# Patient Record
Sex: Male | Born: 1940 | Race: White | Hispanic: No | Marital: Married | Smoking: Former smoker
Health system: Southern US, Community
[De-identification: ages and names within clinical notes are randomized; demographics above are authoritative.]

## PROBLEM LIST (undated history)

## (undated) DIAGNOSIS — I10 Essential (primary) hypertension: Secondary | ICD-10-CM

## (undated) DIAGNOSIS — C329 Malignant neoplasm of larynx, unspecified: Secondary | ICD-10-CM

## (undated) DIAGNOSIS — C320 Malignant neoplasm of glottis: Secondary | ICD-10-CM

## (undated) DIAGNOSIS — M549 Dorsalgia, unspecified: Secondary | ICD-10-CM

## (undated) DIAGNOSIS — F32A Depression, unspecified: Secondary | ICD-10-CM

## (undated) DIAGNOSIS — C770 Secondary and unspecified malignant neoplasm of lymph nodes of head, face and neck: Secondary | ICD-10-CM

## (undated) DIAGNOSIS — G8929 Other chronic pain: Secondary | ICD-10-CM

## (undated) DIAGNOSIS — M1990 Unspecified osteoarthritis, unspecified site: Secondary | ICD-10-CM

## (undated) DIAGNOSIS — J383 Other diseases of vocal cords: Secondary | ICD-10-CM

## (undated) DIAGNOSIS — F329 Major depressive disorder, single episode, unspecified: Secondary | ICD-10-CM

## (undated) DIAGNOSIS — K219 Gastro-esophageal reflux disease without esophagitis: Secondary | ICD-10-CM

## (undated) DIAGNOSIS — F419 Anxiety disorder, unspecified: Secondary | ICD-10-CM

## (undated) DIAGNOSIS — F17210 Nicotine dependence, cigarettes, uncomplicated: Secondary | ICD-10-CM

## (undated) HISTORY — PX: HERNIA REPAIR: SHX51

## (undated) HISTORY — PX: PEG TUBE PLACEMENT: SUR1034

## (undated) HISTORY — PX: TYMPANOPLASTY: SHX33

## (undated) HISTORY — PX: PORTA CATH INSERTION: CATH118285

## (undated) HISTORY — DX: Malignant neoplasm of glottis: C32.0

---

## 2006-12-12 ENCOUNTER — Ambulatory Visit: Payer: Self-pay | Admitting: Family Medicine

## 2007-01-28 ENCOUNTER — Ambulatory Visit: Payer: Self-pay | Admitting: Family Medicine

## 2007-04-29 ENCOUNTER — Ambulatory Visit: Payer: Self-pay | Admitting: Family Medicine

## 2015-10-19 ENCOUNTER — Ambulatory Visit (INDEPENDENT_AMBULATORY_CARE_PROVIDER_SITE_OTHER): Payer: Medicare HMO | Admitting: Otolaryngology

## 2015-10-19 DIAGNOSIS — D380 Neoplasm of uncertain behavior of larynx: Secondary | ICD-10-CM

## 2015-10-19 DIAGNOSIS — R490 Dysphonia: Secondary | ICD-10-CM

## 2015-10-20 ENCOUNTER — Other Ambulatory Visit: Payer: Self-pay | Admitting: Otolaryngology

## 2015-10-20 ENCOUNTER — Encounter (HOSPITAL_BASED_OUTPATIENT_CLINIC_OR_DEPARTMENT_OTHER): Payer: Self-pay | Admitting: *Deleted

## 2015-10-24 ENCOUNTER — Ambulatory Visit (HOSPITAL_BASED_OUTPATIENT_CLINIC_OR_DEPARTMENT_OTHER): Payer: Medicare HMO | Admitting: Anesthesiology

## 2015-10-24 ENCOUNTER — Encounter (HOSPITAL_BASED_OUTPATIENT_CLINIC_OR_DEPARTMENT_OTHER)
Admission: RE | Disposition: A | Discharge status: Home / Self Care | Payer: Self-pay | Source: Ambulatory Visit | Attending: Otolaryngology

## 2015-10-24 ENCOUNTER — Ambulatory Visit (HOSPITAL_BASED_OUTPATIENT_CLINIC_OR_DEPARTMENT_OTHER)
Admission: RE | Admit: 2015-10-24 | Discharge: 2015-10-24 | Disposition: A | Discharge status: Home / Self Care | Payer: Medicare HMO | Source: Ambulatory Visit | Attending: Otolaryngology | Admitting: Otolaryngology

## 2015-10-24 ENCOUNTER — Encounter (HOSPITAL_BASED_OUTPATIENT_CLINIC_OR_DEPARTMENT_OTHER): Payer: Self-pay

## 2015-10-24 DIAGNOSIS — I10 Essential (primary) hypertension: Secondary | ICD-10-CM | POA: Insufficient documentation

## 2015-10-24 DIAGNOSIS — F17200 Nicotine dependence, unspecified, uncomplicated: Secondary | ICD-10-CM | POA: Insufficient documentation

## 2015-10-24 DIAGNOSIS — E785 Hyperlipidemia, unspecified: Secondary | ICD-10-CM | POA: Diagnosis not present

## 2015-10-24 DIAGNOSIS — C320 Malignant neoplasm of glottis: Secondary | ICD-10-CM | POA: Insufficient documentation

## 2015-10-24 DIAGNOSIS — R490 Dysphonia: Secondary | ICD-10-CM | POA: Diagnosis present

## 2015-10-24 DIAGNOSIS — D380 Neoplasm of uncertain behavior of larynx: Secondary | ICD-10-CM | POA: Diagnosis not present

## 2015-10-24 HISTORY — DX: Depression, unspecified: F32.A

## 2015-10-24 HISTORY — DX: Unspecified osteoarthritis, unspecified site: M19.90

## 2015-10-24 HISTORY — DX: Gastro-esophageal reflux disease without esophagitis: K21.9

## 2015-10-24 HISTORY — DX: Other chronic pain: G89.29

## 2015-10-24 HISTORY — PX: MICROLARYNGOSCOPY: SHX5208

## 2015-10-24 HISTORY — DX: Anxiety disorder, unspecified: F41.9

## 2015-10-24 HISTORY — DX: Dorsalgia, unspecified: M54.9

## 2015-10-24 HISTORY — DX: Other diseases of vocal cords: J38.3

## 2015-10-24 HISTORY — DX: Essential (primary) hypertension: I10

## 2015-10-24 HISTORY — DX: Major depressive disorder, single episode, unspecified: F32.9

## 2015-10-24 SURGERY — MICROLARYNGOSCOPY
Anesthesia: General | Site: Throat | Laterality: Right

## 2015-10-24 MED ORDER — EPINEPHRINE HCL 1 MG/ML IJ SOLN
INTRAMUSCULAR | Status: DC | PRN
Start: 2015-10-24 — End: 2015-10-24
  Administered 2015-10-24: .5 mL via ENDOTRACHEOPULMONARY

## 2015-10-24 MED ORDER — ONDANSETRON HCL 4 MG/2ML IJ SOLN
INTRAMUSCULAR | Status: DC | PRN
  Administered 2015-10-24: 4 mg via INTRAVENOUS

## 2015-10-24 MED ORDER — DEXAMETHASONE SODIUM PHOSPHATE 10 MG/ML IJ SOLN
INTRAMUSCULAR | Status: AC
  Filled 2015-10-24: qty 1

## 2015-10-24 MED ORDER — CEFAZOLIN SODIUM-DEXTROSE 2-3 GM-% IV SOLR
INTRAVENOUS | Status: AC
  Filled 2015-10-24: qty 50

## 2015-10-24 MED ORDER — SCOPOLAMINE 1 MG/3DAYS TD PT72: 1.0000 | MEDICATED_PATCH | Freq: Once | TRANSDERMAL | Status: DC | PRN

## 2015-10-24 MED ORDER — ONDANSETRON HCL 4 MG/2ML IJ SOLN
INTRAMUSCULAR | Status: AC
  Filled 2015-10-24: qty 2

## 2015-10-24 MED ORDER — PROPOFOL 10 MG/ML IV BOLUS
INTRAVENOUS | Status: DC | PRN
  Administered 2015-10-24: 150 mg via INTRAVENOUS
  Administered 2015-10-24: 50 mg via INTRAVENOUS

## 2015-10-24 MED ORDER — DEXAMETHASONE SODIUM PHOSPHATE 4 MG/ML IJ SOLN
INTRAMUSCULAR | Status: DC | PRN
  Administered 2015-10-24: 10 mg via INTRAVENOUS

## 2015-10-24 MED ORDER — GLYCOPYRROLATE 0.2 MG/ML IJ SOLN: 0.2000 mg | Freq: Once | INTRAMUSCULAR | Status: DC | PRN

## 2015-10-24 MED ORDER — LACTATED RINGERS IV SOLN
INTRAVENOUS | Status: DC
  Administered 2015-10-24: 08:00:00 via INTRAVENOUS

## 2015-10-24 MED ORDER — FENTANYL CITRATE (PF) 100 MCG/2ML IJ SOLN
INTRAMUSCULAR | Status: AC
  Filled 2015-10-24: qty 4

## 2015-10-24 MED ORDER — MIDAZOLAM HCL 2 MG/2ML IJ SOLN
1.0000 mg | INTRAMUSCULAR | Status: DC | PRN
Start: 2015-10-24 — End: 2015-10-24
  Administered 2015-10-24: 2 mg via INTRAVENOUS

## 2015-10-24 MED ORDER — PROPOFOL 500 MG/50ML IV EMUL
INTRAVENOUS | Status: AC
  Filled 2015-10-24: qty 50

## 2015-10-24 MED ORDER — BACITRACIN 500 UNIT/GM EX OINT
TOPICAL_OINTMENT | CUTANEOUS | Status: DC | PRN
  Administered 2015-10-24: 1 via TOPICAL

## 2015-10-24 MED ORDER — HYDROMORPHONE HCL 1 MG/ML IJ SOLN: 0.2500 mg | INTRAMUSCULAR | Status: DC | PRN

## 2015-10-24 MED ORDER — SUCCINYLCHOLINE CHLORIDE 20 MG/ML IJ SOLN
INTRAMUSCULAR | Status: DC | PRN
  Administered 2015-10-24: 100 mg via INTRAVENOUS

## 2015-10-24 MED ORDER — FENTANYL CITRATE (PF) 100 MCG/2ML IJ SOLN
50.0000 ug | INTRAMUSCULAR | Status: DC | PRN
  Administered 2015-10-24: 100 ug via INTRAVENOUS

## 2015-10-24 MED ORDER — ONDANSETRON HCL 4 MG/2ML IJ SOLN: 4.0000 mg | Freq: Once | INTRAMUSCULAR | Status: DC | PRN

## 2015-10-24 MED ORDER — MEPERIDINE HCL 25 MG/ML IJ SOLN: 6.2500 mg | INTRAMUSCULAR | Status: DC | PRN

## 2015-10-24 MED ORDER — MIDAZOLAM HCL 2 MG/2ML IJ SOLN
INTRAMUSCULAR | Status: AC
  Filled 2015-10-24: qty 4

## 2015-10-24 MED ORDER — LIDOCAINE HCL (CARDIAC) 20 MG/ML IV SOLN
INTRAVENOUS | Status: DC | PRN
  Administered 2015-10-24: 100 mg via INTRAVENOUS

## 2015-10-24 SURGICAL SUPPLY — 22 items
CANISTER SUCT 1200ML W/VALVE (MISCELLANEOUS) ×3 IMPLANT
GLOVE BIO SURGEON STRL SZ7.5 (GLOVE) ×3 IMPLANT
GLOVE SURG SS PI 7.0 STRL IVOR (GLOVE) ×2 IMPLANT
GOWN STRL REUS W/ TWL LRG LVL3 (GOWN DISPOSABLE) ×1 IMPLANT
GOWN STRL REUS W/TWL LRG LVL3 (GOWN DISPOSABLE) ×3
GUARD TEETH (MISCELLANEOUS) ×1 IMPLANT
MARKER SKIN DUAL TIP RULER LAB (MISCELLANEOUS) ×3 IMPLANT
NDL HYPO 18GX1.5 BLUNT FILL (NEEDLE) ×1 IMPLANT
NDL SPNL 22GX7 QUINCKE BK (NEEDLE) IMPLANT
NEEDLE HYPO 18GX1.5 BLUNT FILL (NEEDLE) ×3 IMPLANT
NEEDLE SPNL 22GX7 QUINCKE BK (NEEDLE) IMPLANT
NS IRRIG 1000ML POUR BTL (IV SOLUTION) ×3 IMPLANT
PATTIES SURGICAL .5 X3 (DISPOSABLE) ×3 IMPLANT
SHEET MEDIUM DRAPE 40X70 STRL (DRAPES) ×3 IMPLANT
SLEEVE SCD COMPRESS KNEE MED (MISCELLANEOUS) ×1 IMPLANT
SOLUTION BUTLER CLEAR DIP (MISCELLANEOUS) ×3 IMPLANT
SPONGE GAUZE 4X4 12PLY STER LF (GAUZE/BANDAGES/DRESSINGS) ×3 IMPLANT
SYR CONTROL 10ML LL (SYRINGE) ×3 IMPLANT
SYR TB 1ML LL NO SAFETY (SYRINGE) ×1 IMPLANT
TOWEL OR 17X24 6PK STRL BLUE (TOWEL DISPOSABLE) ×3 IMPLANT
TUBE CONNECTING 20'X1/4 (TUBING) ×1
TUBE CONNECTING 20X1/4 (TUBING) ×2 IMPLANT

## 2015-10-24 NOTE — Op Note (Signed)
DATE OF PROCEDURE:  10/24/2015                              OPERATIVE REPORT  SURGEON:  Leta Baptist, MD  PREOPERATIVE DIAGNOSES: 1. Hoarseness 2. Right vocal cord mass  POSTOPERATIVE DIAGNOSES: 1. Hoarseness 2. Right vocal cord mass  PROCEDURE PERFORMED:  MicroDirect laryngoscopy with biopsy of right vocal cord mass  ANESTHESIA:  General endotracheal tube anesthesia.  COMPLICATIONS:  None.  ESTIMATED BLOOD LOSS:  Minimal.  INDICATION FOR PROCEDURE:  James Swanson is a 74 y.o. male with a 3 month history of hoarseness. The patient has a long history of tobacco use. He has been smoking for more than 50 years. At his clinic visit, he was noted to have a large fungating right vocal cord mass. The mass was concerning for squamous cell carcinoma. Based on the above findings, the decision was made for the patient to undergo the microdirect laryngoscopy and biopsy procedure. The risks, benefits, alternatives, and details of the procedure were discussed with the patient.  Questions were invited and answered.  Informed consent was obtained.  DESCRIPTION:  The patient was taken to the operating room and placed supine on the operating table.  General endotracheal tube anesthesia was administered by the anesthesiologist.  The patient was positioned and prepped and draped in a standard fashion for direct laryngoscopy. A Dedo laryngoscope was used for examination. The laryngoscope was inserted via the oral cavity into the pharynx. The epiglottis, aryepiglottic folds, arytenoids, vallecula, and piriform sinuses were all normal. Examination of the vocal cords revealed a large fungating mass, replacing the anterior two third of the right vocal cord. The mass extended into the subglottis. It should be noted that the vocal cords were mobile during his clinic examination. The laryngoscope was suspended with a Lewy suspender. A microscope was brought into the field. Under the microscope, multiple large biopsy  specimens were obtained from the right vocal cord. The specimens were sent to the pathology department for permanent histologic identification. Hemostasis was achieved with pledgets soaked with epinephrine.  The care of the patient was turned over to the anesthesiologist.  The patient was awakened from anesthesia without difficulty.  He was extubated and transferred to the recovery room in good condition.  OPERATIVE FINDINGS:  A fungating mass was noted to cover the anterior two third of the right vocal cord.  SPECIMEN:  None.  FOLLOWUP CARE:  The patient will be discharged home once awake and alert.The patient will follow up in my office in approximately 1 week.  Otilia Kareem,SUI W 10/24/2015 8:52 AM

## 2015-10-24 NOTE — Anesthesia Postprocedure Evaluation (Signed)
Anesthesia Post Note  Patient: James Swanson  Procedure(s) Performed: Procedure(s) (LRB): MICRO DIRECT LARYNGOSCOPY WITH VOCAL CORD BIOPSY  (Right)  Anesthesia type: general  Patient location: PACU  Post pain: Pain level controlled  Post assessment: Patient's Cardiovascular Status Stable  Last Vitals:  Filed Vitals:   10/24/15 0950  BP: 138/73  Pulse: 67  Temp: 36.4 C  Resp: 22    Post vital signs: Reviewed and stable  Level of consciousness: sedated  Complications: No apparent anesthesia complications

## 2015-10-24 NOTE — Transfer of Care (Signed)
Immediate Anesthesia Transfer of Care Note  Patient: James Swanson  Procedure(s) Performed: Procedure(s): MICRO DIRECT LARYNGOSCOPY WITH VOCAL CORD BIOPSY  (Right)  Patient Location: PACU  Anesthesia Type:General  Level of Consciousness: awake, alert  and oriented  Airway & Oxygen Therapy: Patient Spontanous Breathing and Patient connected to face mask oxygen  Post-op Assessment: Report given to RN and Post -op Vital signs reviewed and stable  Post vital signs: Reviewed and stable  Last Vitals:  Filed Vitals:   10/24/15 0655  BP: 149/71  Pulse: 66  Temp: 36.4 C  Resp: 18    Complications: No apparent anesthesia complications

## 2015-10-24 NOTE — Discharge Instructions (Addendum)
The patient may resume all his previous activities and diet. The patient will follow up in my [place] office in one week.   Post Anesthesia Home Care Instructions  Activity: Get plenty of rest for the remainder of the day. A responsible adult should stay with you for 24 hours following the procedure.  For the next 24 hours, DO NOT: -Drive a car -Paediatric nurse -Drink alcoholic beverages -Take any medication unless instructed by your physician -Make any legal decisions or sign important papers.  Meals: Start with liquid foods such as gelatin or soup. Progress to regular foods as tolerated. Avoid greasy, spicy, heavy foods. If nausea and/or vomiting occur, drink only clear liquids until the nausea and/or vomiting subsides. Call your physician if vomiting continues.  Special Instructions/Symptoms: Your throat may feel dry or sore from the anesthesia or the breathing tube placed in your throat during surgery. If this causes discomfort, gargle with warm salt water. The discomfort should disappear within 24 hours.  If you had a scopolamine patch placed behind your ear for the management of post- operative nausea and/or vomiting:  1. The medication in the patch is effective for 72 hours, after which it should be removed.  Wrap patch in a tissue and discard in the trash. Wash hands thoroughly with soap and water. 2. You may remove the patch earlier than 72 hours if you experience unpleasant side effects which may include dry mouth, dizziness or visual disturbances. 3. Avoid touching the patch. Wash your hands with soap and water after contact with the patch.

## 2015-10-24 NOTE — H&P (Signed)
Cc: Hoarseness  HPI: The patient is a 74 y/o male who presents today with complaint of hoarseness. The patient is seen in consultation requested by Dr. Dione Housekeeper. The patient noted sudden onset of hoarseness about 3 months ago. He states he has good and bad days. He denies any dysphagia or odynophagia. No recent unexplained weight loss is noted. The patient has a history of reflux and takes omeprazole bid. He is a 50+ pack year smoker. The patient had right ear surgery in the past for a tympanic membrane perforation.   The patient's review of systems (constitutional, eyes, ENT, cardiovascular, respiratory, GI, musculoskeletal, skin, neurologic, psychiatric, endocrine, hematologic, allergic) is noted in the ROS questionnaire.  It is reviewed with the patient.   Family health history: None.   Major events: Hernia surgery, ear surgery.   Ongoing medical problems: Hypertension, hyperlipidemia, depression, reflux, chronic back pain.   Social history: The patient is married. He smokes two packs of cigarettes per day. He denies the use of alcohol and illegal drugs.  Exam General: Communicates with difficulty, well nourished, no acute distress. Head: Normocephalic, no evidence injury, no tenderness, facial buttresses intact without stepoff. Eyes: PERRL, EOMI.  No scleral icterus, conjunctivae clear. Ears: External auditory canals clear bilaterally.  There is no edema or erythema.  Tympanic membrane is within normal limits bilaterally. Nose: Normal skin and external support.  Anterior rhinoscopy reveals healthy pink mucosa over the septum and turbinates.  No lesions or polyps were seen. Oral cavity: Lips without lesions, oral mucosa moist, no masses or lesions seen. Indirect  mirror laryngoscopy could not be tolerated. Pharynx: Clear, no erythema. Neck: Supple, full range of motion, no lymphadenopathy, no masses palpable. Salivary: Parotid and submandibular glands without mass. Neuro:  CN 2-12 grossly  intact. Gait normal. Vestibular: No nystagmus at any point of gaze.   Procedure:  Flexible Fiberoptic Laryngoscopy -- Risks, benefits, and alternatives of flexible endoscopy were explained to the patient.  Specific mention was made of the risk of throat numbness with difficulty swallowing, possible bleeding from the nose and mouth, and pain from the procedure.  The patient gave oral consent to proceed.  The nasal cavities were decongested and anesthetised with a combination of oxymetazoline and 4% lidocaine solution.  The flexible scope was inserted into the right nasal cavity and advanced towards the nasopharynx.  Visualized mucosa over the turbinates and septum were normal.  The nasopharynx was clear.  Oropharyngeal walls were symmetric and mobile without lesion, mass, or edema.  Hypopharynx was also without  lesion or edema.  Larynx was mobile without lesions.  No lesions or asymmetry in the supraglottic larynx.  Arytenoid mucosa was edematous.  True vocal folds were pale yellow and edematous with a fungating right vocal cord lesion. Base of tongue was within normal limits. The patient tolerated the procedure well.   Assessment 1.  The patient is noted to have a fungating right vocal cord lesion on today's laryngoscopy exam. The lesion is suspicious for squamous cell carcinoma.   Plan 1.  Laryngoscopy findings are discussed with the patient.  2.  Plan direct laryngoscopy with biopsy. The risks, benefits, alternatives, and details of the procedure are reviewed with the patient. Questions are invited and answered. 3.  The procedure will be scheduled as soon as possible in accordance to the patient's schedule.

## 2015-10-24 NOTE — Anesthesia Procedure Notes (Signed)
Procedure Name: Intubation Date/Time: 10/24/2015 8:15 AM Performed by: Maryella Shivers Pre-anesthesia Checklist: Patient identified, Emergency Drugs available, Suction available and Patient being monitored Patient Re-evaluated:Patient Re-evaluated prior to inductionOxygen Delivery Method: Circle System Utilized Preoxygenation: Pre-oxygenation with 100% oxygen Intubation Type: IV induction Ventilation: Mask ventilation without difficulty Laryngoscope Size: Mac and 3 Grade View: Grade I Tube type: Oral Tube size: 6.5 mm Number of attempts: 1 Airway Equipment and Method: Stylet and Oral airway Placement Confirmation: ETT inserted through vocal cords under direct vision,  positive ETCO2 and breath sounds checked- equal and bilateral Secured at: 22 cm Tube secured with: Tape Dental Injury: Teeth and Oropharynx as per pre-operative assessment

## 2015-10-24 NOTE — Anesthesia Preprocedure Evaluation (Signed)
Anesthesia Evaluation  Patient identified by MRN, date of birth, ID band Patient awake    Reviewed: Allergy & Precautions, NPO status , Patient's Chart, lab work & pertinent test results  Airway Mallampati: I  TM Distance: >3 FB Neck ROM: Full    Dental   Pulmonary Current Smoker,    Pulmonary exam normal        Cardiovascular hypertension, Pt. on medications Normal cardiovascular exam     Neuro/Psych Anxiety Depression    GI/Hepatic GERD  Medicated and Controlled,  Endo/Other    Renal/GU      Musculoskeletal   Abdominal   Peds  Hematology   Anesthesia Other Findings   Reproductive/Obstetrics                             Anesthesia Physical Anesthesia Plan  ASA: II  Anesthesia Plan: General   Post-op Pain Management:    Induction: Intravenous  Airway Management Planned: Oral ETT  Additional Equipment:   Intra-op Plan:   Post-operative Plan: Extubation in OR  Informed Consent: I have reviewed the patients History and Physical, chart, labs and discussed the procedure including the risks, benefits and alternatives for the proposed anesthesia with the patient or authorized representative who has indicated his/her understanding and acceptance.     Plan Discussed with: CRNA and Surgeon  Anesthesia Plan Comments:         Anesthesia Quick Evaluation  

## 2015-10-25 ENCOUNTER — Encounter (HOSPITAL_BASED_OUTPATIENT_CLINIC_OR_DEPARTMENT_OTHER): Payer: Self-pay | Admitting: Otolaryngology

## 2015-10-30 ENCOUNTER — Encounter (HOSPITAL_COMMUNITY): Payer: Self-pay | Admitting: Oncology

## 2015-10-30 ENCOUNTER — Other Ambulatory Visit (HOSPITAL_COMMUNITY): Payer: Self-pay | Admitting: Oncology

## 2015-10-30 DIAGNOSIS — C320 Malignant neoplasm of glottis: Secondary | ICD-10-CM

## 2015-10-30 HISTORY — DX: Malignant neoplasm of glottis: C32.0

## 2015-11-02 ENCOUNTER — Ambulatory Visit (INDEPENDENT_AMBULATORY_CARE_PROVIDER_SITE_OTHER): Payer: Medicare HMO | Admitting: Otolaryngology

## 2015-11-02 DIAGNOSIS — C320 Malignant neoplasm of glottis: Secondary | ICD-10-CM

## 2015-11-08 NOTE — Progress Notes (Signed)
[facility] NOTE  Patient Care Team: Dione Housekeeper, MD as PCP - General (Family Medicine) Eppie Gibson, MD as Attending Physician (Radiation Oncology)  Dr. 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  He notes onset of hoarseness that began approximately 3 to 4 months ago.  He went to his PCP and was given a course of antibiotics.  He says he had no improvement.  He returned to Dr. Edrick Oh and was then referred to Dr. Benjamine Mola.  He denies weight loss, change in appetite, throat pain, hemoptysis. He has a chronic smokers cough. He has a significant smoking history starting at age 34 and smoking 2 ppd since. He also notes a history of reflux and takes ompeprazole.  Upon seeing Dr. Benjamine Mola he underwent a flexible fiberoptic laryngoscopy on 10/19/2015.  A fungating R vocal cord lesion was noted. Vocal cords were mobile. Remainder of exam was unremarkable. Biopsy revealed an invasive squamous cell carcinoma.  He is here today for additional discussion and recommendations on how to proceed.  He underwent a MicroDirect laryngoscopy with biopsy of R vocal mass on 10/25 and the fungating mass was noted to cover the anterior two thirds of the R vocal cord. It extended into the subglottis.   Currently he notes his mother in law is quite ill. He has a lot going on in his family. He however is remaining positive and ready to move forward with necessary therapy.  He understands he needs to quit smoking but that will be challenging.   MEDICAL HISTORY:  Past Medical History  Diagnosis Date  . Hypertension   . Anxiety   . Depression   . GERD (gastroesophageal reflux disease)   . Arthritis     back  . Chronic back pain   . Vocal  cord mass   . Squamous cell carcinoma of vocal cord ([place]) 10/30/2015    SURGICAL HISTORY: Past Surgical History  Procedure Laterality Date  . Tympanoplasty Right   . Hernia repair Right   . Microlaryngoscopy Right 10/24/2015    Procedure: MICRO DIRECT LARYNGOSCOPY WITH VOCAL CORD BIOPSY ;  Surgeon: Leta Baptist, MD;  Location: [facility];  Service: ENT;  Laterality: Right;    SOCIAL HISTORY: Social History   Social History  . Marital Status: Married    Spouse Name: N/A  . Number of Children: N/A  . Years of Education: N/A   Occupational History  . Not on file.   Social History Main Topics  . Smoking status: Current Every Day Smoker -- 1.50 packs/day  . Smokeless tobacco: Never Used  . Alcohol Use: No  . Drug Use: No  . Sexual Activity: Not on file   Other Topics Concern  . Not on file   Social History Narrative  Married 50 years, 4 children, 5 grandchildren. Used to work at [employer]. Made [employer]. Smoker, 2ppd and started at age 11. No ETOH  FAMILY HISTORY: Family History  Problem Relation Age of Onset  . Family history unknown: Yes   has no family status information on file.   Mother is deceased Father died at 21. He is not sure of the cause of death, his parent were separared. 2 brothers.  One is living.  One died in an accident.  ALLERGIES:  is allergic to asa.  MEDICATIONS:  Current Outpatient Prescriptions  Medication Sig Dispense Refill  . amLODipine (NORVASC) 5 MG tablet Take 5 mg by mouth daily.    Marland Kitchen aspirin 81 MG tablet Take 81 mg by mouth daily.    Marland Kitchen escitalopram (LEXAPRO) 10 MG tablet Take 10 mg by mouth daily.    Marland Kitchen HYDROcodone-acetaminophen (NORCO/VICODIN) 5-325 MG tablet Take 1 tablet by mouth every 6 (six) hours as needed for moderate pain.    Marland Kitchen LORazepam (ATIVAN) 0.5 MG tablet Take 0.5 mg by mouth every 8 (eight) hours.    . meloxicam (MOBIC) 15 MG tablet Take 15 mg by mouth daily.    . nitroGLYCERIN (NITROSTAT) 0.4 MG  SL tablet Place 0.4 mg under the tongue every 5 (five) minutes as needed for chest pain.    Marland Kitchen omeprazole (PRILOSEC) 20 MG capsule Take 20 mg by mouth daily.    . pravastatin (PRAVACHOL) 40 MG tablet Take 40 mg by mouth daily.    . Diphenhyd-Hydrocort-Nystatin (FIRST-DUKES MOUTHWASH) SUSP Take 5 mLs by mouth every 4 (four) hours as needed.    Marland Kitchen oxyCODONE (ROXICODONE) 5 MG/5ML solution Take 5-10 mLs (5-10 mg total) by mouth every 4 (four) hours as needed for severe pain. 300 mL 0  . senna (SENOKOT) 8.6 MG tablet Take 2 tablets by mouth daily.     No current facility-administered medications for this visit.    Review of Systems  Constitutional: Negative.  Negative for fever, chills, weight loss and malaise/fatigue.  HENT: Positive for sore throat. Negative for congestion, hearing loss, nosebleeds and tinnitus.        Hoarseness  Eyes: Negative.  Negative for blurred vision, double vision, pain and discharge.  Respiratory: Positive for cough. Negative for hemoptysis, sputum production, shortness of breath and wheezing.   Cardiovascular: Negative.  Negative for chest pain, palpitations, claudication, leg swelling and PND.  Gastrointestinal: Negative.  Negative for heartburn, nausea, vomiting, abdominal pain, diarrhea, constipation, blood in stool and melena.  Genitourinary: Negative.  Negative for dysuria, urgency, frequency and hematuria.  Musculoskeletal: Positive for back pain and joint pain. Negative for myalgias and falls.  Skin: Negative.  Negative for itching and rash.  Neurological: Negative.  Negative for dizziness, tingling, tremors, sensory change, speech change, focal weakness, seizures, loss of consciousness, weakness and headaches.  Endo/Heme/Allergies: Negative.  Does not bruise/bleed easily.  Psychiatric/Behavioral: Negative.  Negative for depression, suicidal ideas, memory loss and substance abuse. The patient is not nervous/anxious and does not have insomnia.   All other systems  reviewed and are negative.  14 point ROS was done and is otherwise as detailed above or in HPI   PHYSICAL EXAMINATION: ECOG PERFORMANCE STATUS: 1 - Symptomatic but completely ambulatory  Filed Vitals:   11/09/15 1529  BP: 131/68  Pulse: 76  Temp: 97.5 F (36.4 C)  Resp: 18   Filed Weights   11/09/15 1529  Weight: 186 lb 11.2 oz (84.687 kg)     Physical Exam  Constitutional: He is oriented to person, place, and time and well-developed, well-nourished, and in no distress. No distress.  Thin  HENT:  Head: Normocephalic and atraumatic.  Nose: Nose normal.  Mouth/Throat: Oropharynx is clear and moist. No oropharyngeal exudate.  Eyes: Conjunctivae and EOM are normal. Pupils are equal, round, and reactive to light. Right eye exhibits no discharge. Left eye exhibits no discharge. No scleral icterus.  Neck: Normal range of motion. Neck supple. No tracheal  deviation present. No thyromegaly present.  Cardiovascular: Normal rate, regular rhythm, normal heart sounds and intact distal pulses.  Exam reveals no gallop and no friction rub.   No murmur heard. Pulmonary/Chest: Effort normal and breath sounds normal. He has no wheezes. He has no rales.  Abdominal: Soft. Bowel sounds are normal. He exhibits no distension and no mass. There is no tenderness. There is no rebound and no guarding.  Musculoskeletal: Normal range of motion. He exhibits no edema.  Lymphadenopathy:    He has no cervical adenopathy.  Neurological: He is alert and oriented to person, place, and time. He has normal reflexes. No cranial nerve deficit. Gait normal. Coordination normal.  Skin: Skin is warm and dry. No rash noted. He is not diaphoretic.  Psychiatric: Mood, memory, affect and judgment normal.  Nursing note and vitals reviewed.    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  I have arranged for CT imaging of the neck and chest. Based upon available clinical information/staging, there is currently not an indication for chemotherapy and I addressed this with him today.  I reviewed the NCCN guidelines in detail.  He prefers to go to [facility] for XRT.  I am referring him to Dr. Isidore Moos.   I addressed the importance of smoking cessation with the patient in detail.  We discussed the health benefits of cessation.  We discussed the health detriments of ongoing tobacco use including but not limited to COPD, heart disease and malignancy. We reviewed the multiple options for cessation and I offered to refer her to smoking cessation classes. We discussed other alternatives to quit such as chantix, wellbutrin. We will continue to address this moving forward.  I will see him back in several weeks for ongoing follow-up and to answer any additional questions he may have.   Orders Placed This Encounter  Procedures  . CT Soft Tissue Neck W Contrast    Standing Status: Future     Number of Occurrences: 1     Standing Expiration Date: 02/08/2017    Order Specific Question:  If indicated for the ordered procedure, I authorize the administration of contrast media per Radiology protocol    Answer:  Yes    Order Specific Question:  Reason for Exam (SYMPTOM  OR DIAGNOSIS REQUIRED)    Answer:  squamous cell carcinoma larynx    Order Specific Question:  Preferred imaging location?    Answer:  [facility]  . CT Chest W Contrast    Standing Status: Future     Number of Occurrences: 1     Standing Expiration Date: 11/08/2016    Order Specific Question:  If indicated for the ordered procedure, I authorize the administration of contrast media per Radiology protocol    Answer:  Yes    Order  Specific Question:  Reason for Exam (SYMPTOM  OR DIAGNOSIS REQUIRED)    Answer:  squamous cell carcinoma larynx    Order Specific Question:  Preferred imaging location?    Answer:  [facility]    All questions were answered. The patient knows to call the clinic with any problems, questions or concerns.  This note was electronically signed.    Molli Hazard, MD  12/25/2015 8:32 AM

## 2015-11-09 ENCOUNTER — Encounter (HOSPITAL_COMMUNITY): Payer: Medicare HMO | Attending: Hematology & Oncology | Admitting: Hematology & Oncology

## 2015-11-09 ENCOUNTER — Encounter (HOSPITAL_COMMUNITY): Payer: Self-pay | Admitting: Hematology & Oncology

## 2015-11-09 VITALS — BP 131/68 | HR 76 | Temp 97.5°F | Resp 18 | Ht 67.0 in | Wt 186.7 lb

## 2015-11-09 DIAGNOSIS — C320 Malignant neoplasm of glottis: Secondary | ICD-10-CM | POA: Diagnosis not present

## 2015-11-09 DIAGNOSIS — Z720 Tobacco use: Secondary | ICD-10-CM | POA: Diagnosis not present

## 2015-11-09 NOTE — Patient Instructions (Addendum)
[facility] at [facility] Discharge Instructions  RECOMMENDATIONS MADE BY THE CONSULTANT AND ANY TEST RESULTS WILL BE SENT TO YOUR REFERRING PHYSICIAN.  Exam and discussion by Dr. Whitney Muse. Will do scans of your neck and chest Will make a referral for a radiation consultation at [facility] in [place] in 1 month.  Thank you for choosing [place] at [facility] to provide your oncology and hematology care.  To afford each patient quality time with our provider, please arrive at least 15 minutes before your scheduled appointment time.    You need to re-schedule your appointment should you arrive 10 or more minutes late.  We strive to give you quality time with our providers, and arriving late affects you and other patients whose appointments are after yours.  Also, if you no show three or more times for appointments you may be dismissed from the clinic at the providers discretion.     Again, thank you for choosing [facility].  Our hope is that these requests will decrease the amount of time that you wait before being seen by our physicians.       _____________________________________________________________  Should you have questions after your visit to [facility], please contact our office at [phone] between the hours of 8:30 a.m. and 4:30 p.m.  Voicemails left after 4:30 p.m. will not be returned until the following business day.  For prescription refill requests, have your pharmacy contact our office.

## 2015-11-10 ENCOUNTER — Ambulatory Visit (HOSPITAL_COMMUNITY): Payer: Medicare HMO

## 2015-11-10 ENCOUNTER — Encounter (HOSPITAL_COMMUNITY): Payer: Self-pay | Admitting: Lab

## 2015-11-10 NOTE — Progress Notes (Signed)
Referral sent to  Rad Onc. Records faxed on 11/11

## 2015-11-10 NOTE — Progress Notes (Unsigned)
Error previous message. Done in error.

## 2015-11-10 NOTE — Progress Notes (Signed)
Referral sent to [facility].  Talked with Joni and will contact patient.  Records faxed on 11/11

## 2015-11-14 ENCOUNTER — Other Ambulatory Visit (HOSPITAL_COMMUNITY): Payer: Medicare HMO

## 2015-11-15 ENCOUNTER — Ambulatory Visit (HOSPITAL_COMMUNITY)
Admission: RE | Admit: 2015-11-15 | Discharge: 2015-11-15 | Disposition: A | Discharge status: Home / Self Care | Payer: Medicare HMO | Source: Ambulatory Visit | Attending: Hematology & Oncology | Admitting: Hematology & Oncology

## 2015-11-15 ENCOUNTER — Other Ambulatory Visit (HOSPITAL_COMMUNITY): Payer: Medicare HMO

## 2015-11-15 DIAGNOSIS — C320 Malignant neoplasm of glottis: Secondary | ICD-10-CM | POA: Diagnosis present

## 2015-11-15 LAB — POCT I-STAT CREATININE: Creatinine, Ser: 1.2 mg/dL (ref 0.61–1.24)

## 2015-11-15 MED ORDER — IOHEXOL 300 MG/ML  SOLN
100.0000 mL | Freq: Once | INTRAMUSCULAR | Status: AC | PRN
  Administered 2015-11-15: 100 mL via INTRAVENOUS

## 2015-12-08 ENCOUNTER — Ambulatory Visit (HOSPITAL_COMMUNITY): Payer: Medicare HMO | Admitting: Oncology

## 2015-12-11 ENCOUNTER — Ambulatory Visit (HOSPITAL_COMMUNITY): Payer: Medicare HMO | Admitting: Oncology

## 2015-12-17 NOTE — Progress Notes (Signed)
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)   10/24/2015 Procedure Right vocal cord biopsy by Dr. Benjamine Mola   10/24/2015 Pathology Results Vocal cord, biopsy, right - INVASIVE SQUAMOUS CELL CARCINOMA   10/24/2015 Pathology Results The invasive tumor demonstrates no significant p16 immunostain expression   11/15/2015 Imaging CT chest- No evidence of metastatic disease in the chest. Mild centrilobular emphysema and mild diffuse bronchial wall thickening, suggesting COPD. Left main and 3 vessel coronary atherosclerosis.   11/15/2015 Imaging CT neck- Right vocal cord mass measures approximately 9 x 11 mm. This is consistent with squamous cell carcinoma. No adenopathy.   11/28/2015 -  Radiation Therapy Planned for 65.25 Gy, Dr. Isidore Moos.    He reports that he has 3 more weeks of XRT.  He notes that he had labs performed by Dr. Edrick Oh on 12/12.  We will forego labs today and ascertain a copy of these results for our review and record.  He reports that his has throat pain.  He denies any phlegm production.  He denies any fevers or chills.  He reports intermittent ear pain that resolves spontaneously.  He is using Hydrocodone with some difficulty swallowing these tablets.  He has used this medication chronically in the past.  He notes that he is taking 2 at a time as his insurance will not cover an increased strength.  He is using [employer] without any improvement either.  We discussed other pain medication options including a increased strength of short-acting pain medication, such as Oxycodone-based medication, and long-acting pain medication, in particular Fentanyl.  He declined any change in his pain medication until his daughter convinced him otherwise at the end of today's visit.  He is hesitant to try a long-acting pain medication at this time.  We discussed liquid Oxycodone and he is agreeable to this.  He knows that Oxycodone is 1.5x more potent than hydrocodone, therefore he does not need to take as much of it for similar pain alleviation effect.  He knows that he is not to take both Hydrocodone and Oxycodone.  I will keep his Radiation Oncologist in the loop on this pain medication change.  He continues to smoke 2 ppd.  "But I don't inhale?"  I have provided him strong smoking cessation education.  Past Medical History  Diagnosis Date  . Hypertension   . Anxiety   . Depression   . GERD (gastroesophageal reflux disease)   . Arthritis     back  . Chronic back pain   . Vocal cord mass   . 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     is allergic to asa.  Current Outpatient Prescriptions on File Prior to Visit  Medication Sig Dispense Refill  . amLODipine (NORVASC) 5 MG tablet Take 5 mg by mouth daily.    Marland Kitchen aspirin 81 MG tablet Take 81 mg by mouth daily.    Marland Kitchen  escitalopram (LEXAPRO) 10 MG tablet Take 10 mg by mouth daily.    Marland Kitchen HYDROcodone-acetaminophen (NORCO/VICODIN) 5-325 MG tablet Take 1 tablet by mouth every 6 (six) hours as needed for moderate pain.    Marland Kitchen LORazepam (ATIVAN) 0.5 MG tablet Take 0.5 mg by mouth every 8 (eight) hours.    . meloxicam (MOBIC) 15 MG tablet Take 15 mg by mouth daily.    . nitroGLYCERIN (NITROSTAT) 0.4 MG SL tablet Place 0.4 mg under the tongue every 5 (five) minutes as needed for chest  pain.    Marland Kitchen omeprazole (PRILOSEC) 20 MG capsule Take 20 mg by mouth daily.    . pravastatin (PRAVACHOL) 40 MG tablet Take 40 mg by mouth daily.     No current facility-administered medications on file prior to visit.    Past Surgical History  Procedure Laterality Date  . Tympanoplasty Right   . Hernia repair Right   . Microlaryngoscopy Right 10/24/2015    Procedure: MICRO DIRECT LARYNGOSCOPY WITH VOCAL CORD BIOPSY ;  Surgeon: Leta Baptist, MD;  Location: [name];  Service: ENT;  Laterality: Right;    Denies any headaches, dizziness, double vision, fevers, chills, night sweats, nausea, vomiting, diarrhea, constipation, chest pain, heart palpitations, shortness of breath, blood in stool, black tarry stool, urinary pain, urinary burning, urinary frequency, hematuria.   PHYSICAL EXAMINATION  ECOG PERFORMANCE STATUS: 1 - Symptomatic but completely ambulatory  Filed Vitals:   12/18/15 0958  BP: 130/75  Pulse: 66  Temp: 98 F (36.7 C)  Resp: 18    GENERAL:alert, no distress, well nourished, well developed, comfortable, cooperative, obese, smiling, chronically ill appearing, and accompanied by hid daughter. SKIN: skin color, texture, turgor are normal, no rashes or significant lesions, thickened facial skin. HEAD: Normocephalic, No masses, lesions, tenderness or abnormalities EYES: normal, EOMI, Conjunctiva are pink and non-injected EARS: External ears normal OROPHARYNX:no exudate, lips, buccal mucosa, and tongue normal and mucous membranes are moist  NECK: supple, no adenopathy, thyroid normal size, non-tender, without nodularity, trachea midline LYMPH:  not examined BREAST:not examined LUNGS: clear to auscultation , decreased breath sounds HEART: regular rate & rhythm, no murmurs and no gallops ABDOMEN:abdomen soft, non-tender and normal bowel sounds BACK: Back symmetric, no curvature. EXTREMITIES:less then 2 second capillary refill, no joint deformities, effusion, or  inflammation, no skin discoloration, no cyanosis  NEURO: alert & oriented x 3 with fluent speech, no focal motor/sensory deficits, gait normal   LABORATORY DATA: CBC No results found for: WBC, RBC, HGB, HCT, PLT, MCV, MCH, MCHC, RDW, LYMPHSABS, MONOABS, EOSABS, BASOSABS    Chemistry      Component Value Date/Time   CREATININE 1.20 11/15/2015 1049   No results found for: CALCIUM, ALKPHOS, AST, ALT, BILITOT     PENDING LABS:   RADIOGRAPHIC STUDIES:  No results found.   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  Oncology history developed.  Staging completed in [facility] Problem list.  He notes that he had labs performed last week by his primary care provider, Dr. Edrick Oh.  We will try to get a copy of those.  Labs in 4 weeks: CBC diff, CMET.  Rx for Roxicet.  He was hesitant to change his pain medication, although he notes poor pain control.  I would not hesitate to prescribe Fentanyl.  We discussed this and he was not interested at this time.  He notes 3 more weeks of radiation is scheduled.  Return in 4 weeks for follow-up.     THERAPY PLAN:  Continue with XRT as described above.  All questions were answered. The patient knows to call the clinic with any problems, questions or concerns. We can certainly see the patient much sooner if necessary.  Patient and plan discussed with Dr. Ancil Linsey and she is in agreement with the aforementioned.   This note is electronically signed by: Robynn Pane, PA-C 12/18/2015 11:03 AM

## 2015-12-17 NOTE — Assessment & Plan Note (Addendum)
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  Oncology history developed.  Staging completed in [facility] Problem list.  He notes that he had labs performed last week by his primary care provider, Dr. Edrick Oh.  We will try to get a copy of those.  Labs in 4 weeks: CBC diff, CMET.  Rx for Roxicet.  He was hesitant to change his pain medication, although he notes poor pain control.  I would not hesitate to prescribe Fentanyl.  We discussed this and he was not interested at this time.    He notes 3 more weeks of radiation is scheduled.  Return in 4 weeks for follow-up.

## 2015-12-18 ENCOUNTER — Encounter (HOSPITAL_COMMUNITY): Payer: Medicare HMO | Attending: Hematology & Oncology | Admitting: Oncology

## 2015-12-18 ENCOUNTER — Encounter (HOSPITAL_COMMUNITY): Payer: Self-pay | Admitting: Oncology

## 2015-12-18 VITALS — BP 130/75 | HR 66 | Temp 98.0°F | Resp 18 | Wt 188.6 lb

## 2015-12-18 DIAGNOSIS — R070 Pain in throat: Secondary | ICD-10-CM

## 2015-12-18 DIAGNOSIS — Z720 Tobacco use: Secondary | ICD-10-CM | POA: Diagnosis not present

## 2015-12-18 DIAGNOSIS — C320 Malignant neoplasm of glottis: Secondary | ICD-10-CM | POA: Insufficient documentation

## 2015-12-18 MED ORDER — OXYCODONE-ACETAMINOPHEN 5-325 MG/5ML PO SOLN: 5.0000 mL | ORAL | Status: DC | PRN

## 2015-12-18 NOTE — Patient Instructions (Signed)
[place] at [facility] Discharge Instructions  RECOMMENDATIONS MADE BY THE CONSULTANT AND ANY TEST RESULTS WILL BE SENT TO YOUR REFERRING PHYSICIAN.  Exam and discussion by Robynn Pane, PA-C Roxicet - take as directed Call with uncontrolled pain, nausea, vomiting, etc.  Follow-up i 4 weeks  Thank you for choosing [place] at [facility] to provide your oncology and hematology care.  To afford each patient quality time with our provider, please arrive at least 15 minutes before your scheduled appointment time.    You need to re-schedule your appointment should you arrive 10 or more minutes late.  We strive to give you quality time with our providers, and arriving late affects you and other patients whose appointments are after yours.  Also, if you no show three or more times for appointments you may be dismissed from the clinic at the providers discretion.     Again, thank you for choosing [facility].  Our hope is that these requests will decrease the amount of time that you wait before being seen by our physicians.       _____________________________________________________________  Should you have questions after your visit to [facility], please contact our office at [phone] between the hours of 8:30 a.m. and 4:30 p.m.  Voicemails left after 4:30 p.m. will not be returned until the following business day.  For prescription refill requests, have your pharmacy contact our office.

## 2015-12-19 ENCOUNTER — Other Ambulatory Visit (HOSPITAL_COMMUNITY): Payer: Self-pay | Admitting: Oncology

## 2015-12-19 DIAGNOSIS — C320 Malignant neoplasm of glottis: Secondary | ICD-10-CM

## 2015-12-19 MED ORDER — OXYCODONE HCL 5 MG/5ML PO SOLN: 5.0000 mg | ORAL | Status: DC | PRN

## 2015-12-25 DIAGNOSIS — Z720 Tobacco use: Secondary | ICD-10-CM | POA: Insufficient documentation

## 2015-12-26 ENCOUNTER — Other Ambulatory Visit (HOSPITAL_COMMUNITY): Payer: Self-pay | Admitting: Oncology

## 2015-12-26 DIAGNOSIS — C320 Malignant neoplasm of glottis: Secondary | ICD-10-CM

## 2015-12-26 MED ORDER — OXYCODONE HCL 5 MG/5ML PO SOLN: 5.0000 mg | ORAL | Status: DC | PRN

## 2016-01-18 ENCOUNTER — Other Ambulatory Visit (HOSPITAL_COMMUNITY): Payer: Medicare HMO

## 2016-01-18 ENCOUNTER — Ambulatory Visit (HOSPITAL_COMMUNITY): Payer: Medicare HMO | Admitting: Hematology & Oncology

## 2016-01-25 ENCOUNTER — Encounter (HOSPITAL_COMMUNITY): Payer: Medicare HMO | Attending: Hematology & Oncology | Admitting: Hematology & Oncology

## 2016-01-25 ENCOUNTER — Encounter (HOSPITAL_COMMUNITY): Payer: Self-pay | Admitting: Hematology & Oncology

## 2016-01-25 ENCOUNTER — Encounter (HOSPITAL_COMMUNITY): Payer: Medicare HMO

## 2016-01-25 VITALS — BP 131/65 | HR 70 | Temp 98.0°F | Resp 18 | Wt 170.1 lb

## 2016-01-25 DIAGNOSIS — R634 Abnormal weight loss: Secondary | ICD-10-CM | POA: Diagnosis not present

## 2016-01-25 DIAGNOSIS — C320 Malignant neoplasm of glottis: Secondary | ICD-10-CM

## 2016-01-25 DIAGNOSIS — E876 Hypokalemia: Secondary | ICD-10-CM

## 2016-01-25 DIAGNOSIS — Z720 Tobacco use: Secondary | ICD-10-CM | POA: Diagnosis not present

## 2016-01-25 LAB — COMPREHENSIVE METABOLIC PANEL
ALBUMIN: 3.3 g/dL — AB (ref 3.5–5.0)
ALT: 23 U/L (ref 17–63)
AST: 20 U/L (ref 15–41)
Alkaline Phosphatase: 36 U/L — ABNORMAL LOW (ref 38–126)
Anion gap: 9 (ref 5–15)
BUN: 11 mg/dL (ref 6–20)
CHLORIDE: 104 mmol/L (ref 101–111)
CO2: 29 mmol/L (ref 22–32)
Calcium: 8.6 mg/dL — ABNORMAL LOW (ref 8.9–10.3)
Creatinine, Ser: 0.92 mg/dL (ref 0.61–1.24)
GFR calc Af Amer: >60 mL/min (ref >60)
GFR calc non Af Amer: >60 mL/min (ref >60)
GLUCOSE: 111 mg/dL — AB (ref 65–99)
POTASSIUM: 3 mmol/L — AB (ref 3.5–5.1)
SODIUM: 142 mmol/L (ref 135–145)
Total Bilirubin: 0.7 mg/dL (ref 0.3–1.2)
Total Protein: 5.9 g/dL — ABNORMAL LOW (ref 6.5–8.1)

## 2016-01-25 LAB — CBC WITH DIFFERENTIAL/PLATELET
BASOS ABS: 0 10*3/uL (ref 0.0–0.1)
BASOS PCT: 0 %
EOS PCT: 3 %
Eosinophils Absolute: 0.2 10*3/uL (ref 0.0–0.7)
HEMATOCRIT: 40.6 % (ref 39.0–52.0)
Hemoglobin: 13.7 g/dL (ref 13.0–17.0)
Lymphocytes Relative: 18 %
Lymphs Abs: 1.2 10*3/uL (ref 0.7–4.0)
MCH: 29.2 pg (ref 26.0–34.0)
MCHC: 33.7 g/dL (ref 30.0–36.0)
MCV: 86.6 fL (ref 78.0–100.0)
MONO ABS: 0.5 10*3/uL (ref 0.1–1.0)
Monocytes Relative: 7 %
NEUTROS ABS: 5.1 10*3/uL (ref 1.7–7.7)
Neutrophils Relative %: 72 %
PLATELETS: 150 10*3/uL (ref 150–400)
RBC: 4.69 MIL/uL (ref 4.22–5.81)
RDW: 13.1 % (ref 11.5–15.5)
WBC: 7 10*3/uL (ref 4.0–10.5)

## 2016-01-25 MED ORDER — HYDROCODONE-ACETAMINOPHEN 5-325 MG PO TABS: 1.0000 | ORAL_TABLET | ORAL | Status: DC | PRN

## 2016-01-25 MED ORDER — POTASSIUM CHLORIDE CRYS ER 20 MEQ PO TBCR: 20.0000 meq | EXTENDED_RELEASE_TABLET | Freq: Two times a day (BID) | ORAL | Status: DC

## 2016-01-25 NOTE — Patient Instructions (Addendum)
[place] at [facility] Discharge Instructions  RECOMMENDATIONS MADE BY THE CONSULTANT AND ANY TEST RESULTS WILL BE SENT TO YOUR REFERRING PHYSICIAN.     Exam and discussion completed by Dr Whitney Muse today  Lab work today  We are to send a message to Burtis Junes our nutritionist, he should be in touch with you.  Hydrocodone refilled today  Constipation sheet given  Your potassium is low today its 3.0, we will call you in a prescription to your pharmacy.You are going to take this for 7 days twice a day.  Return to see the doctor in 2 weeks, we are going to watch your weight  Please call the clinic if you have any questions or concerns     Thank you for choosing [place] at [facility] to provide your oncology and hematology care.  To afford each patient quality time with our provider, please arrive at least 15 minutes before your scheduled appointment time.   Beginning January 23rd 2017 lab work for the [facility] will be done in the  Main lab at [facility] on 1st floor. If you have a lab appointment with the [facility] please come in thru the  Main Entrance and check in at the main information desk  You need to re-schedule your appointment should you arrive 10 or more minutes late.  We strive to give you quality time with our providers, and arriving late affects you and other patients whose appointments are after yours.  Also, if you no show three or more times for appointments you may be dismissed from the clinic at the providers discretion.     Again, thank you for choosing [facility].  Our hope is that these requests will decrease the amount of time that you wait before being seen by our physicians.       _____________________________________________________________  Should you have questions after your visit to [facility], please contact our office at [phone] between the hours of 8:30  a.m. and 4:30 p.m.  Voicemails left after 4:30 p.m. will not be returned until the following business day.  For prescription refill requests, have your pharmacy contact our office.

## 2016-01-25 NOTE — Progress Notes (Signed)
[place] at [facility] NOTE  Patient Care Team: Dione Housekeeper, MD as PCP - General (Family Medicine) Eppie Gibson, MD as Attending Physician (Radiation Oncology)  Dr. Benjamine Mola  CHIEF COMPLAINTS/PURPOSE OF CONSULTATION:  Carcinoma of the R vocal cord, invasive squamous cell carcinoma Negative p16 staining  HISTORY OF PRESENTING ILLNESS:  James Swanson 75 y.o. male is here for follow-up of invasive squamous cell carcinoma of the R vocal cord.   He has a significant smoking history starting at age 8 and smoking 2 ppd since. He also notes a history of reflux and takes ompeprazole.  Upon seeing Dr. Benjamine Mola he underwent a flexible fiberoptic laryngoscopy on 10/19/2015.  A fungating R vocal cord lesion was noted. Vocal cords were mobile. Remainder of exam was unremarkable. Biopsy revealed an invasive squamous cell carcinoma. He underwent a [employer] laryngoscopy with biopsy of R vocal mass on 10/25 and the fungating mass was noted to cover the anterior two thirds of the R vocal cord. It extended into the subglottis.   He has completed Radiation.  He notes that he had a lot of difficulty with pain throughout his course. He did not like taking prescribed pain medications because they made him feel "funny." He notes that one night he woke up "crying." His weight is down 18 pounds but today he states his oral intake is improving. He has quit smoking and notes he has no desire to restart.    MEDICAL HISTORY:  Past Medical History  Diagnosis Date  . Hypertension   . Anxiety   . Depression   . GERD (gastroesophageal reflux disease)   . Arthritis     back  . Chronic back pain   . Vocal cord mass   . Squamous cell carcinoma of vocal cord ([place]) 10/30/2015    SURGICAL HISTORY: Past Surgical History  Procedure Laterality Date  . Tympanoplasty Right   . Hernia repair Right   . Microlaryngoscopy Right 10/24/2015    Procedure: MICRO DIRECT LARYNGOSCOPY WITH VOCAL CORD  BIOPSY ;  Surgeon: Leta Baptist, MD;  Location: [place];  Service: ENT;  Laterality: Right;    SOCIAL HISTORY: Social History   Social History  . Marital Status: Married    Spouse Name: N/A  . Number of Children: N/A  . Years of Education: N/A   Occupational History  . Not on file.   Social History Main Topics  . Smoking status: Current Every Day Smoker -- 1.50 packs/day  . Smokeless tobacco: Never Used  . Alcohol Use: No  . Drug Use: No  . Sexual Activity: Not on file   Other Topics Concern  . Not on file   Social History Narrative  Married 50 years, 4 children, 5 grandchildren. Used to work at [employer]. Made [employer]. Smoker, 2ppd and started at age 89. No ETOH  FAMILY HISTORY: Family History  Problem Relation Age of Onset  . Family history unknown: Yes   has no family status information on file.   Mother is deceased Father died at 36. He is not sure of the cause of death, his parent were separared. 2 brothers.  One is living. One died in an accident.  ALLERGIES:  is allergic to asa.  MEDICATIONS:  Current Outpatient Prescriptions  Medication Sig Dispense Refill  . amLODipine (NORVASC) 5 MG tablet Take 5 mg by mouth daily.    Marland Kitchen aspirin 81 MG tablet Take 81 mg by mouth daily.    Marland Kitchen  escitalopram (LEXAPRO) 10 MG tablet Take 10 mg by mouth daily.    Marland Kitchen HYDROcodone-acetaminophen (NORCO/VICODIN) 5-325 MG tablet Take 1-2 tablets by mouth every 4 (four) hours as needed for moderate pain. 120 tablet 0  . LORazepam (ATIVAN) 0.5 MG tablet Take 0.5 mg by mouth every 8 (eight) hours.    . meloxicam (MOBIC) 15 MG tablet Take 15 mg by mouth daily.    . nitroGLYCERIN (NITROSTAT) 0.4 MG SL tablet Place 0.4 mg under the tongue every 5 (five) minutes as needed for chest pain. Reported on 01/25/2016    . omeprazole (PRILOSEC) 20 MG capsule Take 20 mg by mouth daily.    . pravastatin (PRAVACHOL) 40 MG tablet Take 40 mg by mouth daily.    Marland Kitchen senna (SENOKOT) 8.6  MG tablet Take 2 tablets by mouth daily.    . Diphenhyd-Hydrocort-Nystatin (FIRST-DUKES MOUTHWASH) SUSP Take 5 mLs by mouth every 4 (four) hours as needed. Reported on 01/25/2016    . oxyCODONE (ROXICODONE) 5 MG/5ML solution Take 5-10 mLs (5-10 mg total) by mouth every 4 (four) hours as needed for severe pain. (Patient not taking: Reported on 01/25/2016) 420 mL 0  . potassium chloride SA (K-DUR,KLOR-CON) 20 MEQ tablet Take 1 tablet (20 mEq total) by mouth 2 (two) times daily. 14 tablet 0   No current facility-administered medications for this visit.    Review of Systems  Constitutional: Negative.  Negative for fever, chills, weight loss and malaise/fatigue.  HENT: Positive for sore throat. Negative for congestion, hearing loss, nosebleeds and tinnitus.        Hoarseness  Eyes: Negative.  Negative for blurred vision, double vision, pain and discharge.  Respiratory: Positive for cough. Negative for hemoptysis, sputum production, shortness of breath and wheezing.   Cardiovascular: Negative.  Negative for chest pain, palpitations, claudication, leg swelling and PND.  Gastrointestinal: Negative.  Negative for heartburn, nausea, vomiting, abdominal pain, diarrhea, constipation, blood in stool and melena.  Genitourinary: Negative.  Negative for dysuria, urgency, frequency and hematuria.  Musculoskeletal: Positive for back pain and joint pain. Negative for myalgias and falls.  Skin: Negative.  Negative for itching and rash.  Neurological: Negative.  Negative for dizziness, tingling, tremors, sensory change, speech change, focal weakness, seizures, loss of consciousness, weakness and headaches.  Endo/Heme/Allergies: Negative.  Does not bruise/bleed easily.  Psychiatric/Behavioral: Negative.  Negative for depression, suicidal ideas, memory loss and substance abuse. The patient is not nervous/anxious and does not have insomnia.   All other systems reviewed and are negative.  14 point ROS was done and is  otherwise as detailed above or in HPI   PHYSICAL EXAMINATION: ECOG PERFORMANCE STATUS: 1 - Symptomatic but completely ambulatory  Filed Vitals:   01/25/16 0921  BP: 131/65  Pulse: 70  Temp: 98 F (36.7 C)  Resp: 18   Filed Weights   01/25/16 0921  Weight: 170 lb 1.6 oz (77.157 kg)     Physical Exam  Constitutional: He is oriented to person, place, and time and well-developed, well-nourished, and in no distress. No distress.  Thin  HENT:  Head: Normocephalic and atraumatic.  Nose: Nose normal.  Mouth/Throat: Oropharynx is clear and moist. No oropharyngeal exudate.  Eyes: Conjunctivae and EOM are normal. Pupils are equal, round, and reactive to light. Right eye exhibits no discharge. Left eye exhibits no discharge. No scleral icterus.  Neck: Normal range of motion. Neck supple. No tracheal deviation present. No thyromegaly present.  Mild XRT changes  Cardiovascular: Normal rate, regular rhythm, normal heart  sounds and intact distal pulses.  Exam reveals no gallop and no friction rub.   No murmur heard. Pulmonary/Chest: Effort normal and breath sounds normal. He has no wheezes. He has no rales.  Abdominal: Soft. Bowel sounds are normal. He exhibits no distension and no mass. There is no tenderness. There is no rebound and no guarding.  Musculoskeletal: Normal range of motion. He exhibits no edema.  Lymphadenopathy:    He has no cervical adenopathy.  Neurological: He is alert and oriented to person, place, and time. He has normal reflexes. No cranial nerve deficit. Gait normal. Coordination normal.  Skin: Skin is warm and dry. No rash noted. He is not diaphoretic.  Psychiatric: Mood, memory, affect and judgment normal.  Nursing note and vitals reviewed.    LABORATORY DATA:  I have reviewed the data as listed Lab Results  Component Value Date   WBC 7.0 01/25/2016   HGB 13.7 01/25/2016   HCT 40.6 01/25/2016   MCV 86.6 01/25/2016   PLT 150 01/25/2016   CMP       Component Value Date/Time   NA 142 01/25/2016 0901   K 3.0* 01/25/2016 0901   CL 104 01/25/2016 0901   CO2 29 01/25/2016 0901   GLUCOSE 111* 01/25/2016 0901   BUN 11 01/25/2016 0901   CREATININE 0.92 01/25/2016 0901   CALCIUM 8.6* 01/25/2016 0901   PROT 5.9* 01/25/2016 0901   ALBUMIN 3.3* 01/25/2016 0901   AST 20 01/25/2016 0901   ALT 23 01/25/2016 0901   ALKPHOS 36* 01/25/2016 0901   BILITOT 0.7 01/25/2016 0901   GFRNONAA >60 01/25/2016 0901   GFRAA >60 01/25/2016 0901     ASSESSMENT & PLAN:  T2N0M0, 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 Continues to complain of pain although it is improving and his oral intake is increasing. Hydrocodone was refilled. He did not like other pain medication Nutrition was encouraged. Kdur was provided for 7 days. RTC 2 weeks for weight check and ongoing f/u. He has discontinued smoking and this will certainly be encouraged moving forward.  All questions were answered. The patient knows to call the clinic with any problems, questions or concerns.  This note was electronically signed.    Molli Hazard, MD  01/25/2016 9:41 AM

## 2016-02-02 ENCOUNTER — Encounter: Payer: Self-pay | Admitting: Dietician

## 2016-02-02 NOTE — Progress Notes (Signed)
Dietitian asked to see patient by oncology nursing for nutritional assessment.   Contacted Pt by Phone   Wt Readings from Last 10 Encounters:  01/25/16 170 lb 1.6 oz (77.157 kg)  12/18/15 188 lb 9.6 oz (85.548 kg)  11/09/15 186 lb 11.2 oz (84.687 kg)  10/24/15 185 lb 2 oz (83.972 kg)   Patient has lost 18 lbs since diagnosis and undergoing radiation.  Pt was not available; spoke with wife. Pt's wife reports that pt's "throat is still bothering him". Pt will eat "off and on" throughout day. He has tried Ensure/boost but does not consume these on a regular basis. Pt is now able to tolerate regular food. Wife reports he eats chicken off the bone as well as other meats with no signs of dysphagia. However, she does report trouble swallowing his liquids, his coffee in particular. This can "strangle him" if he is not careful. She says pt is not currently seeing a speech therapist.   RD briefly went over foods that are generally well tolerated and not well tolerated for individuals recovering from throat irradiation. Reccommended avoiding acidic, spicy, and hot foods. Wife reports that pt has had citrus containing beverages and has tolerated them fine. Patient mainly eats sweets; He eats plenty of ice cream, pudding, jello.   Wife does not know what pt's weight is now as they do not own a scale  Overall, it sounds like pt is recovering from radiation well and is not at significant nutritional risk.   Pt has follow up appointment at [facility] in 1 week.  Mailed my contact info, coupons, and handouts titled "Sore or Irritated Throat"  Burtis Junes RD, LDN Nutrition Pager: [phone] 02/02/2016 11:37 AM

## 2016-02-08 ENCOUNTER — Ambulatory Visit (HOSPITAL_COMMUNITY): Payer: Medicare HMO | Admitting: Oncology

## 2016-02-09 ENCOUNTER — Encounter (HOSPITAL_COMMUNITY): Payer: Medicare HMO | Attending: Hematology & Oncology | Admitting: Oncology

## 2016-02-09 ENCOUNTER — Encounter (HOSPITAL_COMMUNITY): Payer: Medicare HMO | Admitting: Oncology

## 2016-02-09 ENCOUNTER — Encounter (HOSPITAL_COMMUNITY): Payer: Self-pay | Admitting: Oncology

## 2016-02-09 DIAGNOSIS — C320 Malignant neoplasm of glottis: Secondary | ICD-10-CM | POA: Diagnosis not present

## 2016-02-09 NOTE — Patient Instructions (Signed)
[place] at [facility] Discharge Instructions  RECOMMENDATIONS MADE BY THE CONSULTANT AND ANY TEST RESULTS WILL BE SENT TO YOUR REFERRING PHYSICIAN.    Thank you for choosing [place] at [facility] to provide your oncology and hematology care.  To afford each patient quality time with our provider, please arrive at least 15 minutes before your scheduled appointment time.   Beginning January 23rd 2017 lab work for the [facility] will be done in the  Main lab at [facility] on 1st floor. If you have a lab appointment with the [facility] please come in thru the  Main Entrance and check in at the main information desk  You need to re-schedule your appointment should you arrive 10 or more minutes late.  We strive to give you quality time with our providers, and arriving late affects you and other patients whose appointments are after yours.  Also, if you no show three or more times for appointments you may be dismissed from the clinic at the providers discretion.     Again, thank you for choosing [facility].  Our hope is that these requests will decrease the amount of time that you wait before being seen by our physicians.       _____________________________________________________________  Should you have questions after your visit to [facility], please contact our office at [phone] between the hours of 8:30 a.m. and 4:30 p.m.  Voicemails left after 4:30 p.m. will not be returned until the following business day.  For prescription refill requests, have your pharmacy contact our office.

## 2016-02-09 NOTE — Progress Notes (Signed)
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)   10/24/2015 Procedure Right vocal cord biopsy by Dr. Benjamine Mola   10/24/2015 Pathology Results Vocal cord, biopsy, right - INVASIVE SQUAMOUS CELL CARCINOMA   10/24/2015 Pathology Results The invasive tumor demonstrates no significant p16 immunostain expression   11/15/2015 Imaging CT chest- No evidence of metastatic disease in the chest. Mild centrilobular emphysema and mild diffuse bronchial wall thickening, suggesting COPD. Left main and 3 vessel coronary atherosclerosis.   11/15/2015 Imaging CT neck- Right vocal cord mass measures approximately 9 x 11 mm. This is consistent with squamous cell carcinoma. No adenopathy.   11/28/2015 - 01/10/2016 Radiation Therapy 65.25 Gy in 29 fractions, Dr. Isidore Moos.   He quit smoking a number of weeks ago.  He is using a vapor cigarettes.  He is encouraged to refrain from smoking.    He notes his appetite is strong.  His weight is stable.  He denies any nausea or vomiting.  He continues with a hoarse voice, but it is improved compared to the last time I saw him.  Past Medical History  Diagnosis Date  . Hypertension   . Anxiety   . Depression   . GERD (gastroesophageal reflux disease)   . Arthritis     back  . Chronic back pain   . Vocal cord mass   . 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     is allergic to asa.  Current Outpatient Prescriptions on File Prior to Visit  Medication Sig Dispense Refill  . amLODipine (NORVASC) 5 MG tablet Take 5 mg by mouth daily.    Marland Kitchen aspirin  81 MG tablet Take 81 mg by mouth daily.    . Diphenhyd-Hydrocort-Nystatin (FIRST-DUKES MOUTHWASH) SUSP Take 5 mLs by mouth every 4 (four) hours as needed. Reported on 01/25/2016    . escitalopram (LEXAPRO) 10 MG tablet Take 10 mg by mouth daily.    Marland Kitchen HYDROcodone-acetaminophen (NORCO/VICODIN) 5-325 MG tablet Take 1-2 tablets by mouth every 4 (four) hours as needed for moderate pain. 120 tablet 0  . LORazepam (ATIVAN) 0.5 MG tablet Take 0.5 mg by mouth every 8 (eight) hours.    . meloxicam (MOBIC) 15 MG tablet Take 15 mg by mouth daily.    . nitroGLYCERIN (NITROSTAT) 0.4 MG SL tablet Place 0.4 mg under the tongue every 5 (five) minutes as needed for chest pain. Reported on 01/25/2016    . omeprazole (PRILOSEC) 20 MG capsule Take 20 mg by mouth daily.    Marland Kitchen oxyCODONE (ROXICODONE) 5 MG/5ML solution Take 5-10 mLs (5-10 mg total) by mouth every 4 (four) hours as needed for severe pain. 420 mL 0  . potassium chloride SA (K-DUR,KLOR-CON) 20 MEQ tablet Take 1 tablet (20 mEq total) by mouth 2 (two) times daily. 14 tablet 0  . pravastatin (PRAVACHOL) 40 MG tablet Take 40 mg by mouth daily.    Marland Kitchen senna (SENOKOT) 8.6 MG tablet Take 2 tablets by mouth daily.    Marland Kitchen zolpidem (AMBIEN) 10 MG tablet Take 10 mg by mouth at bedtime.  No current facility-administered medications on file prior to visit.    Past Surgical History  Procedure Laterality Date  . Tympanoplasty Right   . Hernia repair Right   . Microlaryngoscopy Right 10/24/2015    Procedure: MICRO DIRECT LARYNGOSCOPY WITH VOCAL CORD BIOPSY ;  Surgeon: Leta Baptist, MD;  Location: [place];  Service: ENT;  Laterality: Right;    Denies any headaches, dizziness, double vision, fevers, chills, night sweats, nausea, vomiting, diarrhea, constipation, chest pain, heart palpitations, shortness of breath, blood in stool, black tarry stool, urinary pain, urinary burning, urinary frequency, hematuria.   PHYSICAL EXAMINATION  ECOG PERFORMANCE STATUS:  1 - Symptomatic but completely ambulatory  BP 152/84 P 74 R 20 T 97.92F O2 Sat 98%    GE/NERAL:alert, no distress, well nourished, well developed, comfortable, cooperative, obese, smiling, chronically ill appearing, and accompanied by his daughter. SKIN: skin color, texture, turgor are normal, no rashes or significant lesions, thickened facial skin. HEAD: Normocephalic, No masses, lesions, tenderness or abnormalities EYES: normal, EOMI, Conjunctiva are pink and non-injected EARS: External ears normal OROPHARYNX:not examined NECK: supple,  trachea midline LYMPH:  not examined BREAST:not examined LUNGS:not examined HEART: not examined ABDOMEN:not examined BACK: Back symmetric, no curvature. EXTREMITIES:not examined NEURO: alert & oriented x 3 with fluent speech, no focal motor/sensory deficits, gait normal   LABORATORY DATA: CBC    Component Value Date/Time   WBC 7.0 01/25/2016 0901   RBC 4.69 01/25/2016 0901   HGB 13.7 01/25/2016 0901   HCT 40.6 01/25/2016 0901   PLT 150 01/25/2016 0901   MCV 86.6 01/25/2016 0901   MCH 29.2 01/25/2016 0901   MCHC 33.7 01/25/2016 0901   RDW 13.1 01/25/2016 0901   LYMPHSABS 1.2 01/25/2016 0901   MONOABS 0.5 01/25/2016 0901   EOSABS 0.2 01/25/2016 0901   BASOSABS 0.0 01/25/2016 0901      Chemistry      Component Value Date/Time   NA 142 01/25/2016 0901   K 3.0* 01/25/2016 0901   CL 104 01/25/2016 0901   CO2 29 01/25/2016 0901   BUN 11 01/25/2016 0901   CREATININE 0.92 01/25/2016 0901      Component Value Date/Time   CALCIUM 8.6* 01/25/2016 0901   ALKPHOS 36* 01/25/2016 0901   AST 20 01/25/2016 0901   ALT 23 01/25/2016 0901   BILITOT 0.7 01/25/2016 0901       PENDING LABS:   RADIOGRAPHIC STUDIES:  No results found.   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  Oncology history is  up-to-date.  His weight is stable:  170 lbs- 02/09/2016  170.1 lbs- 01/25/16  188.6 lbs - 12/18/15  186.7 lbs- 11/09/15  185.1 lbs- 10/24/15  He quit smoking a few weeks ago.  He is using a vapor cigarette.  Unfortunately, prior to the patient's exam, I was called to the treatment area for a reaction to chemotherapy.  I told the patient I would be back.  20-30 minutes later, when I returned, the patient had left the clinic.  Return in 3-4 weeks for follow-up.    THERAPY PLAN:  NCCN guidelines for surveillance of Head and Neck cancer recommends:  A. H+P every 1-3 months for year 1  B. H+P every 2-6 months for year 2  C. H+P every 4-8 months for years 3-5  D. H+P every year for years greater than 5  E.  Imaging only as clinically indicated following baseline imaging study within 6 months of completion of therapy.   F. Chest imaging as clinically indicated for patients with smoking history.  G. Further re-imaging as indicated based on worrisome or equivocal signs/symptoms, smoking history, and areas inaccessible to clinical examination.  H. Routine annual imaging may be indicated in areas difficult to visualize on exam.  I. TSH every 6-12 months if neck irradiated.  J. Dental evaluation   1. Recommended for oral cavity and sites exposed to significant intraoral radiation treatment.  K. Consider EBV DNA monitoring for nasopharyngeal cancer (Category 2B).  L. Supportive care and rehabilitation   1. Speech/hearing and swallowing evaluation and rehabilitation as clinically indicated.   2. Nutritional evaluation and rehabilitation as clinically indicated until nutritional status is stabilized.   3. Ongoing surveillance for depression   4. Smoking cessation and alcohol counseling as clinically indicated.  M. For response assessment immediately after chemoradiation or RT (see FOLL-A 2 of 2).  N. Integration of survivorship care and care plan within 1 year, complementary to ongoing involvement  from a head and neck oncologist.   All questions were answered. The patient knows to call the clinic with any problems, questions or concerns. We can certainly see the patient much sooner if necessary.  Patient and plan discussed with Dr. Ancil Linsey and she is in agreement with the aforementioned.   This note is electronically signed by: Doy Mince 02/09/2016 5:12 PM

## 2016-02-09 NOTE — Assessment & Plan Note (Signed)
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  Oncology history is up-to-date.  His weight is stable:  170 lbs- 02/09/2016  170.1 lbs- 01/25/16  188.6 lbs - 12/18/15  186.7 lbs- 11/09/15  185.1 lbs- 10/24/15  He quit smoking a few weeks ago.  He is using a vapor cigarette.  Unfortunately, prior to the patient's exam, I was called to the treatment area for a reaction to chemotherapy.  I told the patient I would be back.  20-30 minutes later, when I returned, the patient had left the clinic.  Return in 3-4 weeks for follow-up.

## 2016-02-09 NOTE — Progress Notes (Signed)
This encounter was created in error - please disregard.

## 2016-03-08 ENCOUNTER — Encounter (HOSPITAL_COMMUNITY): Payer: Medicare HMO | Attending: Hematology & Oncology | Admitting: Hematology & Oncology

## 2016-03-08 DIAGNOSIS — C320 Malignant neoplasm of glottis: Secondary | ICD-10-CM | POA: Insufficient documentation

## 2016-03-08 NOTE — Progress Notes (Signed)
This encounter was created in error - please disregard.

## 2016-03-14 ENCOUNTER — Ambulatory Visit (INDEPENDENT_AMBULATORY_CARE_PROVIDER_SITE_OTHER): Payer: Medicare HMO | Admitting: Otolaryngology

## 2016-03-14 DIAGNOSIS — Z8521 Personal history of malignant neoplasm of larynx: Secondary | ICD-10-CM | POA: Diagnosis not present

## 2016-05-02 ENCOUNTER — Encounter (HOSPITAL_COMMUNITY): Payer: Self-pay | Admitting: Hematology & Oncology

## 2016-07-01 ENCOUNTER — Ambulatory Visit (INDEPENDENT_AMBULATORY_CARE_PROVIDER_SITE_OTHER): Payer: Medicare HMO | Admitting: Otolaryngology

## 2016-07-01 DIAGNOSIS — Z8521 Personal history of malignant neoplasm of larynx: Secondary | ICD-10-CM

## 2016-08-27 ENCOUNTER — Telehealth (HOSPITAL_COMMUNITY): Payer: Self-pay | Admitting: *Deleted

## 2016-08-27 ENCOUNTER — Encounter (HOSPITAL_COMMUNITY): Payer: Self-pay | Admitting: Oncology

## 2016-08-27 ENCOUNTER — Encounter (HOSPITAL_COMMUNITY): Payer: Medicare HMO | Attending: Oncology | Admitting: Oncology

## 2016-08-27 ENCOUNTER — Encounter (HOSPITAL_COMMUNITY): Payer: Medicare HMO

## 2016-08-27 VITALS — BP 128/79 | HR 68 | Temp 98.0°F | Resp 16 | Ht 68.0 in | Wt 189.0 lb

## 2016-08-27 DIAGNOSIS — C320 Malignant neoplasm of glottis: Secondary | ICD-10-CM | POA: Insufficient documentation

## 2016-08-27 LAB — CBC WITH DIFFERENTIAL/PLATELET
Basophils Absolute: 0 10*3/uL (ref 0.0–0.1)
Basophils Relative: 1 %
Eosinophils Absolute: 0.2 10*3/uL (ref 0.0–0.7)
Eosinophils Relative: 4 %
HCT: 41.3 % (ref 39.0–52.0)
Hemoglobin: 14 g/dL (ref 13.0–17.0)
Lymphocytes Relative: 24 %
Lymphs Abs: 1.3 10*3/uL (ref 0.7–4.0)
MCH: 30.6 pg (ref 26.0–34.0)
MCHC: 33.9 g/dL (ref 30.0–36.0)
MCV: 90.2 fL (ref 78.0–100.0)
Monocytes Absolute: 0.7 10*3/uL (ref 0.1–1.0)
Monocytes Relative: 12 %
NEUTROS ABS: 3.2 10*3/uL (ref 1.7–7.7)
NEUTROS PCT: 59 %
PLATELETS: 143 10*3/uL — AB (ref 150–400)
RBC: 4.58 MIL/uL (ref 4.22–5.81)
RDW: 13.4 % (ref 11.5–15.5)
WBC: 5.5 10*3/uL (ref 4.0–10.5)

## 2016-08-27 LAB — COMPREHENSIVE METABOLIC PANEL
ALT: 11 U/L — ABNORMAL LOW (ref 17–63)
AST: 12 U/L — ABNORMAL LOW (ref 15–41)
Albumin: 4.3 g/dL (ref 3.5–5.0)
Alkaline Phosphatase: 28 U/L — ABNORMAL LOW (ref 38–126)
Anion gap: 9 (ref 5–15)
BUN: 17 mg/dL (ref 6–20)
CO2: 25 mmol/L (ref 22–32)
Calcium: 9.3 mg/dL (ref 8.9–10.3)
Chloride: 105 mmol/L (ref 101–111)
Creatinine, Ser: 0.93 mg/dL (ref 0.61–1.24)
Glucose, Bld: 107 mg/dL — ABNORMAL HIGH (ref 65–99)
POTASSIUM: 4.5 mmol/L (ref 3.5–5.1)
Sodium: 139 mmol/L (ref 135–145)
Total Bilirubin: 1.2 mg/dL (ref 0.3–1.2)
Total Protein: 7.1 g/dL (ref 6.5–8.1)

## 2016-08-27 LAB — TSH: TSH: 0.447 u[IU]/mL (ref 0.350–4.500)

## 2016-08-27 NOTE — Patient Instructions (Signed)
[place] at [facility] Discharge Instructions  RECOMMENDATIONS MADE BY THE CONSULTANT AND ANY TEST RESULTS WILL BE SENT TO YOUR REFERRING PHYSICIAN.  You saw Kirby Crigler PA-C today. Gershon Mussel is referring you to GI and SLP for the trouble you are having swallowing.  You will have labs drawn today as well.  And we will see you back in 2 months for follow up.   Thank you for choosing [place] at [facility] to provide your oncology and hematology care.  To afford each patient quality time with our provider, please arrive at least 15 minutes before your scheduled appointment time.   Beginning January 23rd 2017 lab work for the [facility] will be done in the  Main lab at [facility] on 1st floor. If you have a lab appointment with the [place] [facility] please come in thru the  Main Entrance and check in at the main information desk  You need to re-schedule your appointment should you arrive 10 or more minutes late.  We strive to give you quality time with our providers, and arriving late affects you and other patients whose appointments are after yours.  Also, if you no show three or more times for appointments you may be dismissed from the clinic at the providers discretion.     Again, thank you for choosing [facility].  Our hope is that these requests will decrease the amount of time that you wait before being seen by our physicians.       _____________________________________________________________  Should you have questions after your visit to [facility], please contact our office at [phone] between the hours of 8:30 a.m. and 4:30 p.m.  Voicemails left after 4:30 p.m. will not be returned until the following business day.  For prescription refill requests, have your pharmacy contact our office.         Resources For Cancer Patients and their Caregivers ? American Cancer Society: Can assist with  transportation, wigs, general needs, runs Look Good Feel Better.        670-650-1897 ? Cancer Care: Provides financial assistance, online support groups, medication/co-pay assistance.  1-800-813-HOPE 201 788 0746) ? [place] Assists [place] cancer patients and their families through emotional , educational and financial support.  810-415-5444 ? [place] DSS Where to apply for food stamps, Medicaid and utility assistance. (314) 888-6700 ? [employer]: Transportation to medical appointments. 223-745-2727 ? Social Security Administration: May apply for disability if have a Stage IV cancer. (808)424-8050 254-186-9444 ? [employer], Disability and Transit Services: Assists with nutrition, care and transit needs. [facility] Support Programs: @10RELATIVEDAYS @ > Cancer Support Group  2nd Tuesday of the month 1pm-2pm, Journey Room  > Creative Journey  3rd Tuesday of the month 1130am-1pm, Journey Room  > Look Good Feel Better  1st Wednesday of the month 10am-12 noon, Journey Room (Call [place] to register [phone])

## 2016-08-27 NOTE — Progress Notes (Signed)
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 Benjamine Mola      10/24/2015 Pathology Results    Vocal cord, biopsy, right - INVASIVE SQUAMOUS CELL CARCINOMA      10/24/2015 Pathology Results    The invasive tumor demonstrates no significant p16 immunostain expression      11/15/2015 Imaging    CT chest- No evidence of metastatic disease in the chest. Mild centrilobular emphysema and mild diffuse bronchial wall thickening, suggesting COPD. Left main and 3 vessel coronary atherosclerosis.      11/15/2015 Imaging    CT neck- Right vocal cord mass measures approximately 9 x 11 mm. This is consistent with squamous cell carcinoma. No adenopathy.      11/28/2015 - 01/10/2016 Radiation Therapy    65.25 Gy in 29 fractions, Dr. Isidore Moos.       He is seen today after leaving prior to the completion of his Feb 2017 appointment without a return visit.  He is brought in by his granddaughter with a few complaints: sick, nausea/vomiting, gagging with food, problems swallowing, fatigue, and tiredness.    Ascertaining detailed information regarding these issues is difficult as he does not provide details to my questions.  He does not think these issues were present when he saw Dr. Edrick Oh in June.  He does not know how long he has felt ill.  He notes that he saw Dr. Benjamine Mola 2 months ago and has follow-up in 1 month.  He has not seen XRT is quite some time.  He does not know when he has follow-up with Dr. Isidore Moos.  He reports difficulty with swallowing foods including  meats.  He favors soft foods.  His granddaughter notes some difficulty with liquids as well.    His weight is up 19 lbs since Feb 2017.    "What makes me so tired?"  "I think his cancer has done spreaded" his granddaughter reports.  Review of Systems  Constitutional: Positive for malaise/fatigue. Negative for chills, fever and weight loss.  HENT: Negative.   Eyes: Negative.  Negative for blurred vision and double vision.  Respiratory: Negative.  Negative for cough.   Cardiovascular: Negative.  Negative for chest pain.  Gastrointestinal: Positive for nausea and vomiting.  Genitourinary: Negative.   Musculoskeletal: Negative.   Skin: Negative.   Neurological: Negative.  Negative for weakness.  Endo/Heme/Allergies: Negative.   Psychiatric/Behavioral: Negative.     Past Medical History:  Diagnosis Date  . Anxiety   . Arthritis    back  . Chronic back pain   . Depression   . GERD (gastroesophageal reflux disease)   . Hypertension   . Squamous cell carcinoma of vocal cord ([place]) 10/30/2015  . Vocal cord mass     Past Surgical History:  Procedure Laterality Date  . HERNIA REPAIR Right   . MICROLARYNGOSCOPY Right 10/24/2015   Procedure: MICRO DIRECT LARYNGOSCOPY WITH VOCAL CORD BIOPSY ;  Surgeon: Leta Baptist, MD;  Location: [place];  Service: ENT;  Laterality: Right;  . TYMPANOPLASTY Right  Family History  Problem Relation Age of Onset  . Family history unknown: Yes    Social History   Social History  . Marital status: Married    Spouse name: N/A  . Number of children: N/A  . Years of education: N/A   Social History Main Topics  . Smoking status: Current Every Day Smoker    Packs/day: 1.50  . Smokeless tobacco: Never Used  . Alcohol use No  . Drug use: No  . Sexual activity: Not Asked   Other Topics Concern  . None   Social History Narrative  . None     PHYSICAL EXAMINATION  ECOG PERFORMANCE STATUS: 1 - Symptomatic but completely  ambulatory  Vitals:   08/27/16 0829  BP: 128/79  Pulse: 68  Resp: 16  Temp: 98 F (36.7 C)    GENERAL:alert, no distress, well nourished, well developed, comfortable, cooperative and accompanied by his granddaughter. SKIN: skin color, texture, turgor are normal, no rashes or significant lesions HEAD: Normocephalic, No masses, lesions, tenderness or abnormalities EYES: normal, EOMI, Conjunctiva are pink and non-injected EARS: External ears normal OROPHARYNX:lips, buccal mucosa, and tongue normal and mucous membranes are moist  NECK: supple, no adenopathy, thyroid normal size, non-tender, without nodularity, trachea midline LYMPH:  no palpable lymphadenopathy BREAST:not examined LUNGS: clear to auscultation and percussion HEART: regular rate & rhythm, no murmurs, no gallops, S1 normal and S2 normal ABDOMEN:abdomen soft, obese and normal bowel sounds BACK: Back symmetric, no curvature., No CVA tenderness EXTREMITIES:less then 2 second capillary refill, no joint deformities, effusion, or inflammation, no skin discoloration, no cyanosis  NEURO: alert & oriented x 3 with fluent speech, no focal motor/sensory deficits, gait normal   LABORATORY DATA: CBC    Component Value Date/Time   WBC 7.0 01/25/2016 0901   RBC 4.69 01/25/2016 0901   HGB 13.7 01/25/2016 0901   HCT 40.6 01/25/2016 0901   PLT 150 01/25/2016 0901   MCV 86.6 01/25/2016 0901   MCH 29.2 01/25/2016 0901   MCHC 33.7 01/25/2016 0901   RDW 13.1 01/25/2016 0901   LYMPHSABS 1.2 01/25/2016 0901   MONOABS 0.5 01/25/2016 0901   EOSABS 0.2 01/25/2016 0901   BASOSABS 0.0 01/25/2016 0901      Chemistry      Component Value Date/Time   NA 142 01/25/2016 0901   K 3.0 (L) 01/25/2016 0901   CL 104 01/25/2016 0901   CO2 29 01/25/2016 0901   BUN 11 01/25/2016 0901   CREATININE 0.92 01/25/2016 0901      Component Value Date/Time   CALCIUM 8.6 (L) 01/25/2016 0901   ALKPHOS 36 (L) 01/25/2016 0901   AST 20 01/25/2016 0901     ALT 23 01/25/2016 0901   BILITOT 0.7 01/25/2016 0901        PENDING LABS:   RADIOGRAPHIC STUDIES:  No results found.   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  Oncology history is up-to-date.  Labs today: CBC diff, CMET, TSH.  I personally reviewed and went over laboratory results with the patient.  The results are noted within this dictation.  History of noncompliance with appointments.  Pilar Plate discussion regarding his appointment compliance is discussed.  Continue with ongoing follow-up with XRT and ENT.  He reports an appointment with Dr. Benjamine Mola next month.  He is encouraged to maintain this appointment.  Will refer to GI for dysphagia, nausea,  and vomiting.  I will refer to SLP for evaluation and management.  He is having difficulty with swallowing resulting in "choking" and "gagging."    Return in 2 months for follow-up.    ORDERS PLACED FOR THIS ENCOUNTER: Orders Placed This Encounter  Procedures  . TSH    MEDICATIONS PRESCRIBED THIS ENCOUNTER: Meds ordered this encounter  Medications  . amLODipine (NORVASC) 5 MG tablet    Sig: TAKE ONE TABLET BY MOUTH ONCE DAILY  . escitalopram (LEXAPRO) 10 MG tablet    Sig: TAKE ONE TABLET BY MOUTH ONCE DAILY  . HYDROcodone-acetaminophen (NORCO/VICODIN) 5-325 MG tablet    Sig: Take by mouth.  Marland Kitchen LORazepam (ATIVAN) 0.5 MG tablet    Sig: TAKE ONE TABLET BY MOUTH 4 TIMES DAILY  . nitroGLYCERIN (NITROSTAT) 0.4 MG SL tablet    Sig: DISSOLVE ONE TABLET UNDER THE TONGUE EVERY 5 MINUTES AS NEEDED FOR CHEST PAIN.  DO NOT EXCEED A TOTAL OF 3 DOSES IN 15 MINUTES  . omeprazole (PRILOSEC) 20 MG capsule    Sig: TAKE ONE CAPSULE BY MOUTH TWICE DAILY    THERAPY PLAN:  NCCN guidelines for surveillance of Head and Neck cancer recommends (1.2017):  A. H+P every 1-3 months for year 1  B. H+P every 2-6 months  for year 2  C. H+P every 4-8 months for years 3-5  D. H+P every year for years greater than 5  E. Post-treatment baseline imaging of primary (and neck, if treated) recommended within 6 months of treatment (category 2B).   F. Chest imaging as clinically indicated for patients with smoking history.  G. Further re-imaging as indicated based on worrisome or equivocal signs/symptoms, smoking history, and areas inaccessible to clinical examination.  H. Routine annual imaging may be indicated in areas difficult to visualize on exam.  I. TSH every 6-12 months if neck irradiated.  J. Dental evaluation   1. Recommended for oral cavity and sites exposed to significant intraoral radiation treatment.  K. Consider EBV DNA monitoring for nasopharyngeal cancer (Category 2B).  L. Supportive care and rehabilitation   1. Speech/hearing and swallowing evaluation and rehabilitation as clinically indicated.   2. Nutritional evaluation and rehabilitation as clinically indicated until nutritional status is stabilized.   3. Ongoing surveillance for depression   4. Smoking cessation and alcohol counseling as clinically indicated.  M. For response assessment immediately after chemoradiation or RT (see FOLL-A 2 of 2).  N. Integration of survivorship care and care plan within 1 year, complementary to ongoing involvement from a head and neck oncologist.   All questions were answered. The patient knows to call the clinic with any problems, questions or concerns. We can certainly see the patient much sooner if necessary.  Patient and plan discussed with Dr. Ancil Linsey and she is in agreement with the aforementioned.   This note is electronically signed by: Doy Mince 08/27/2016 9:14 AM

## 2016-08-27 NOTE — Telephone Encounter (Signed)
Pt aware that labs are good.  

## 2016-08-27 NOTE — Assessment & Plan Note (Addendum)
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  Oncology history is up-to-date.  Labs today: CBC diff, CMET, TSH.  I personally reviewed and went over laboratory results with the patient.  The results are noted within this dictation.  History of noncompliance with appointments.  Pilar Plate discussion regarding his appointment compliance is discussed.  Continue with ongoing follow-up with XRT and ENT.  He reports an appointment with Dr. Benjamine Mola next month.  He is encouraged to maintain this appointment.  Will refer to GI for dysphagia, nausea, and vomiting.  I will refer to SLP for evaluation and management.  He is having difficulty with swallowing resulting in "choking" and "gagging."    Return in 2 months for follow-up.

## 2016-08-27 NOTE — Telephone Encounter (Signed)
-----   Message from [facility], PA-C sent at 08/27/2016  3:24 PM EDT ----- Labs are good!  Let him know.  Verify that he has been referred to GI and SLP.

## 2016-08-28 ENCOUNTER — Telehealth (HOSPITAL_COMMUNITY): Payer: Self-pay | Admitting: Hematology & Oncology

## 2016-08-28 ENCOUNTER — Other Ambulatory Visit (HOSPITAL_COMMUNITY): Payer: Self-pay | Admitting: Oncology

## 2016-08-28 DIAGNOSIS — C320 Malignant neoplasm of glottis: Secondary | ICD-10-CM

## 2016-08-28 NOTE — Progress Notes (Signed)
slp  

## 2016-08-28 NOTE — Telephone Encounter (Signed)
CALLED PT AGAIN AND LEFT ANOTHER MSG RE REF FOR SLP. REF PENDING RTN CALL

## 2016-08-29 ENCOUNTER — Other Ambulatory Visit (HOSPITAL_COMMUNITY): Payer: Self-pay | Admitting: Oncology

## 2016-08-29 DIAGNOSIS — C320 Malignant neoplasm of glottis: Secondary | ICD-10-CM

## 2016-09-03 ENCOUNTER — Telehealth (HOSPITAL_COMMUNITY): Payer: Self-pay | Admitting: Speech Pathology

## 2016-09-03 NOTE — Telephone Encounter (Signed)
Called again as promised, spoke to wife she ask that I call back after 2:30 pm . I called again @ 4:39pm Spoke to [name] today he declined our services and did not want to schedule an apptment. NF 08/30/16 @ 4:41pm

## 2016-09-27 ENCOUNTER — Inpatient Hospital Stay
Admission: AD | Admit: 2016-09-27 | Payer: Self-pay | Source: Other Acute Inpatient Hospital | Admitting: Family Medicine

## 2016-10-21 ENCOUNTER — Ambulatory Visit (INDEPENDENT_AMBULATORY_CARE_PROVIDER_SITE_OTHER): Payer: Medicare HMO | Admitting: Otolaryngology

## 2016-10-21 DIAGNOSIS — Z8521 Personal history of malignant neoplasm of larynx: Secondary | ICD-10-CM

## 2016-10-22 ENCOUNTER — Ambulatory Visit (HOSPITAL_COMMUNITY): Payer: Medicare HMO | Admitting: Physical Therapy

## 2016-10-24 ENCOUNTER — Ambulatory Visit (HOSPITAL_COMMUNITY): Payer: Medicare HMO | Admitting: Physical Therapy

## 2016-10-28 ENCOUNTER — Ambulatory Visit (HOSPITAL_COMMUNITY): Payer: Medicare HMO | Admitting: Hematology & Oncology

## 2016-12-02 ENCOUNTER — Ambulatory Visit (HOSPITAL_COMMUNITY): Payer: Medicare HMO | Admitting: Hematology & Oncology

## 2016-12-02 NOTE — Progress Notes (Signed)
This encounter was created in error - please disregard.

## 2016-12-16 ENCOUNTER — Encounter: Payer: Self-pay | Admitting: Internal Medicine

## 2017-01-03 ENCOUNTER — Telehealth: Payer: Self-pay | Admitting: Gastroenterology

## 2017-01-03 ENCOUNTER — Ambulatory Visit: Payer: Medicare HMO | Admitting: Gastroenterology

## 2017-01-03 ENCOUNTER — Encounter: Payer: Self-pay | Admitting: Gastroenterology

## 2017-01-03 NOTE — Telephone Encounter (Signed)
PATIENT WAS A NO SHOW AND LETTER SENT  °

## 2017-02-04 ENCOUNTER — Other Ambulatory Visit (HOSPITAL_COMMUNITY): Payer: Self-pay | Admitting: *Deleted

## 2017-04-24 ENCOUNTER — Ambulatory Visit (INDEPENDENT_AMBULATORY_CARE_PROVIDER_SITE_OTHER): Payer: Medicare HMO | Admitting: Otolaryngology

## 2017-04-24 DIAGNOSIS — Z8521 Personal history of malignant neoplasm of larynx: Secondary | ICD-10-CM

## 2017-07-28 ENCOUNTER — Ambulatory Visit (INDEPENDENT_AMBULATORY_CARE_PROVIDER_SITE_OTHER): Payer: Medicare HMO | Admitting: Otolaryngology

## 2017-08-28 ENCOUNTER — Ambulatory Visit (INDEPENDENT_AMBULATORY_CARE_PROVIDER_SITE_OTHER): Payer: Medicare HMO | Admitting: Otolaryngology

## 2017-08-28 DIAGNOSIS — Z8521 Personal history of malignant neoplasm of larynx: Secondary | ICD-10-CM | POA: Diagnosis not present

## 2017-11-10 ENCOUNTER — Ambulatory Visit (INDEPENDENT_AMBULATORY_CARE_PROVIDER_SITE_OTHER): Payer: Medicare HMO | Admitting: Otolaryngology

## 2017-11-10 DIAGNOSIS — Z8521 Personal history of malignant neoplasm of larynx: Secondary | ICD-10-CM | POA: Diagnosis not present

## 2017-11-10 DIAGNOSIS — R590 Localized enlarged lymph nodes: Secondary | ICD-10-CM | POA: Diagnosis not present

## 2017-11-10 DIAGNOSIS — H6123 Impacted cerumen, bilateral: Secondary | ICD-10-CM | POA: Diagnosis not present

## 2017-11-10 DIAGNOSIS — D487 Neoplasm of uncertain behavior of other specified sites: Secondary | ICD-10-CM

## 2017-11-11 ENCOUNTER — Other Ambulatory Visit (INDEPENDENT_AMBULATORY_CARE_PROVIDER_SITE_OTHER): Payer: Self-pay | Admitting: Otolaryngology

## 2017-11-11 DIAGNOSIS — R221 Localized swelling, mass and lump, neck: Secondary | ICD-10-CM

## 2017-11-11 DIAGNOSIS — Z8521 Personal history of malignant neoplasm of larynx: Secondary | ICD-10-CM

## 2017-11-21 ENCOUNTER — Ambulatory Visit (HOSPITAL_COMMUNITY)
Admission: RE | Admit: 2017-11-21 | Discharge: 2017-11-21 | Disposition: A | Discharge status: Home / Self Care | Payer: Medicare HMO | Source: Ambulatory Visit | Attending: Otolaryngology | Admitting: Otolaryngology

## 2017-11-21 ENCOUNTER — Encounter (HOSPITAL_COMMUNITY): Payer: Self-pay

## 2017-11-21 DIAGNOSIS — I82C11 Acute embolism and thrombosis of right internal jugular vein: Secondary | ICD-10-CM | POA: Insufficient documentation

## 2017-11-21 DIAGNOSIS — I700 Atherosclerosis of aorta: Secondary | ICD-10-CM | POA: Diagnosis not present

## 2017-11-21 DIAGNOSIS — J439 Emphysema, unspecified: Secondary | ICD-10-CM | POA: Insufficient documentation

## 2017-11-21 DIAGNOSIS — R221 Localized swelling, mass and lump, neck: Secondary | ICD-10-CM | POA: Diagnosis not present

## 2017-11-21 DIAGNOSIS — Z8521 Personal history of malignant neoplasm of larynx: Secondary | ICD-10-CM | POA: Diagnosis present

## 2017-11-21 LAB — POCT I-STAT CREATININE: CREATININE: 1 mg/dL (ref 0.61–1.24)

## 2017-11-21 MED ORDER — IOPAMIDOL (ISOVUE-300) INJECTION 61%
75.0000 mL | Freq: Once | INTRAVENOUS | Status: AC | PRN
  Administered 2017-11-21: 75 mL via INTRAVENOUS

## 2017-11-24 ENCOUNTER — Ambulatory Visit (INDEPENDENT_AMBULATORY_CARE_PROVIDER_SITE_OTHER): Payer: Medicare HMO | Admitting: Otolaryngology

## 2017-11-24 ENCOUNTER — Other Ambulatory Visit (INDEPENDENT_AMBULATORY_CARE_PROVIDER_SITE_OTHER): Payer: Self-pay | Admitting: Otolaryngology

## 2017-11-24 DIAGNOSIS — C770 Secondary and unspecified malignant neoplasm of lymph nodes of head, face and neck: Secondary | ICD-10-CM | POA: Diagnosis not present

## 2017-11-28 ENCOUNTER — Other Ambulatory Visit: Payer: Self-pay | Admitting: Otolaryngology

## 2017-12-31 ENCOUNTER — Other Ambulatory Visit: Payer: Self-pay

## 2017-12-31 ENCOUNTER — Encounter (HOSPITAL_BASED_OUTPATIENT_CLINIC_OR_DEPARTMENT_OTHER): Payer: Self-pay | Admitting: *Deleted

## 2018-01-05 ENCOUNTER — Encounter (HOSPITAL_BASED_OUTPATIENT_CLINIC_OR_DEPARTMENT_OTHER): Payer: Self-pay | Admitting: *Deleted

## 2018-01-05 ENCOUNTER — Ambulatory Visit (HOSPITAL_BASED_OUTPATIENT_CLINIC_OR_DEPARTMENT_OTHER)
Admission: RE | Admit: 2018-01-05 | Discharge: 2018-01-05 | Disposition: A | Discharge status: Home / Self Care | Payer: Medicare HMO | Source: Ambulatory Visit | Attending: Otolaryngology | Admitting: Otolaryngology

## 2018-01-05 ENCOUNTER — Ambulatory Visit (HOSPITAL_BASED_OUTPATIENT_CLINIC_OR_DEPARTMENT_OTHER): Payer: Medicare HMO | Admitting: Anesthesiology

## 2018-01-05 ENCOUNTER — Encounter (HOSPITAL_BASED_OUTPATIENT_CLINIC_OR_DEPARTMENT_OTHER)
Admission: RE | Disposition: A | Discharge status: Home / Self Care | Payer: Self-pay | Source: Ambulatory Visit | Attending: Otolaryngology

## 2018-01-05 ENCOUNTER — Other Ambulatory Visit: Payer: Self-pay

## 2018-01-05 DIAGNOSIS — C490 Malignant neoplasm of connective and soft tissue of head, face and neck: Secondary | ICD-10-CM | POA: Diagnosis not present

## 2018-01-05 DIAGNOSIS — Z8521 Personal history of malignant neoplasm of larynx: Secondary | ICD-10-CM | POA: Diagnosis not present

## 2018-01-05 DIAGNOSIS — F17210 Nicotine dependence, cigarettes, uncomplicated: Secondary | ICD-10-CM | POA: Diagnosis not present

## 2018-01-05 DIAGNOSIS — Z923 Personal history of irradiation: Secondary | ICD-10-CM | POA: Insufficient documentation

## 2018-01-05 DIAGNOSIS — R221 Localized swelling, mass and lump, neck: Secondary | ICD-10-CM | POA: Diagnosis present

## 2018-01-05 DIAGNOSIS — D487 Neoplasm of uncertain behavior of other specified sites: Secondary | ICD-10-CM

## 2018-01-05 DIAGNOSIS — I10 Essential (primary) hypertension: Secondary | ICD-10-CM | POA: Diagnosis not present

## 2018-01-05 HISTORY — DX: Nicotine dependence, cigarettes, uncomplicated: F17.210

## 2018-01-05 HISTORY — PX: MASS BIOPSY: SHX5445

## 2018-01-05 SURGERY — BIOPSY, MASS, NECK
Anesthesia: General | Site: Neck | Laterality: Right

## 2018-01-05 MED ORDER — LIDOCAINE 2% (20 MG/ML) 5 ML SYRINGE
INTRAMUSCULAR | Status: DC | PRN
  Administered 2018-01-05: 60 mg via INTRAVENOUS

## 2018-01-05 MED ORDER — CEFAZOLIN SODIUM-DEXTROSE 2-3 GM-%(50ML) IV SOLR
INTRAVENOUS | Status: DC | PRN
  Administered 2018-01-05: 2 g via INTRAVENOUS

## 2018-01-05 MED ORDER — SCOPOLAMINE 1 MG/3DAYS TD PT72: 1.0000 | MEDICATED_PATCH | Freq: Once | TRANSDERMAL | Status: DC | PRN

## 2018-01-05 MED ORDER — MIDAZOLAM HCL 2 MG/2ML IJ SOLN
1.0000 mg | INTRAMUSCULAR | Status: DC | PRN
  Administered 2018-01-05: 1 mg via INTRAVENOUS

## 2018-01-05 MED ORDER — LIDOCAINE-EPINEPHRINE 1 %-1:100000 IJ SOLN
INTRAMUSCULAR | Status: DC | PRN
  Administered 2018-01-05: 2.5 mL

## 2018-01-05 MED ORDER — ONDANSETRON HCL 4 MG/2ML IJ SOLN
INTRAMUSCULAR | Status: DC | PRN
  Administered 2018-01-05: 4 mg via INTRAVENOUS

## 2018-01-05 MED ORDER — MEPERIDINE HCL 25 MG/ML IJ SOLN: 6.2500 mg | INTRAMUSCULAR | Status: DC | PRN

## 2018-01-05 MED ORDER — SUCCINYLCHOLINE CHLORIDE 20 MG/ML IJ SOLN
INTRAMUSCULAR | Status: DC | PRN
  Administered 2018-01-05: 120 mg via INTRAVENOUS

## 2018-01-05 MED ORDER — FENTANYL CITRATE (PF) 100 MCG/2ML IJ SOLN
50.0000 ug | INTRAMUSCULAR | Status: AC | PRN
  Administered 2018-01-05: 50 ug via INTRAVENOUS
  Administered 2018-01-05 (×2): 25 ug via INTRAVENOUS

## 2018-01-05 MED ORDER — FENTANYL CITRATE (PF) 100 MCG/2ML IJ SOLN
INTRAMUSCULAR | Status: AC
  Filled 2018-01-05: qty 2

## 2018-01-05 MED ORDER — AMOXICILLIN 875 MG PO TABS: 875.0000 mg | ORAL_TABLET | Freq: Two times a day (BID) | ORAL | 0 refills | Status: DC

## 2018-01-05 MED ORDER — FENTANYL CITRATE (PF) 100 MCG/2ML IJ SOLN
25.0000 ug | INTRAMUSCULAR | Status: DC | PRN
  Administered 2018-01-05 (×2): 25 ug via INTRAVENOUS

## 2018-01-05 MED ORDER — OXYCODONE-ACETAMINOPHEN 5-325 MG PO TABS: 1.0000 | ORAL_TABLET | ORAL | 0 refills | Status: DC | PRN

## 2018-01-05 MED ORDER — DEXAMETHASONE SODIUM PHOSPHATE 4 MG/ML IJ SOLN
INTRAMUSCULAR | Status: DC | PRN
  Administered 2018-01-05: 10 mg via INTRAVENOUS

## 2018-01-05 MED ORDER — PROPOFOL 10 MG/ML IV BOLUS
INTRAVENOUS | Status: AC
  Filled 2018-01-05: qty 20

## 2018-01-05 MED ORDER — MIDAZOLAM HCL 2 MG/2ML IJ SOLN
INTRAMUSCULAR | Status: AC
  Filled 2018-01-05: qty 2

## 2018-01-05 MED ORDER — PROPOFOL 10 MG/ML IV BOLUS
INTRAVENOUS | Status: DC | PRN
  Administered 2018-01-05: 150 mg via INTRAVENOUS
  Administered 2018-01-05: 50 mg via INTRAVENOUS

## 2018-01-05 MED ORDER — LACTATED RINGERS IV SOLN
INTRAVENOUS | Status: DC
  Administered 2018-01-05 (×2): via INTRAVENOUS

## 2018-01-05 SURGICAL SUPPLY — 77 items
ADH SKN CLS APL DERMABOND .7 (GAUZE/BANDAGES/DRESSINGS) ×1
APL SKNCLS STERI-STRIP NONHPOA (GAUZE/BANDAGES/DRESSINGS)
BENZOIN TINCTURE PRP APPL 2/3 (GAUZE/BANDAGES/DRESSINGS) IMPLANT
BLADE SURG 15 STRL LF DISP TIS (BLADE) ×1 IMPLANT
BLADE SURG 15 STRL SS (BLADE) ×3
CANISTER SUCT 1200ML W/VALVE (MISCELLANEOUS) IMPLANT
CLEANER CAUTERY TIP 5X5 PAD (MISCELLANEOUS) IMPLANT
CLIP VESOCCLUDE MED 6/CT (CLIP) IMPLANT
CLIP VESOCCLUDE SM WIDE 6/CT (CLIP) IMPLANT
CLOSURE WOUND 1/4X4 (GAUZE/BANDAGES/DRESSINGS)
CORD BIPOLAR FORCEPS 12FT (ELECTRODE) IMPLANT
COVER BACK TABLE 60X90IN (DRAPES) ×3 IMPLANT
COVER MAYO STAND STRL (DRAPES) ×3 IMPLANT
DECANTER SPIKE VIAL GLASS SM (MISCELLANEOUS) IMPLANT
DERMABOND ADVANCED (GAUZE/BANDAGES/DRESSINGS) ×2
DERMABOND ADVANCED .7 DNX12 (GAUZE/BANDAGES/DRESSINGS) IMPLANT
DRAIN JACKSON RD 7FR 3/32 (WOUND CARE) IMPLANT
DRAIN PENROSE 1/4X12 LTX STRL (WOUND CARE) IMPLANT
DRAIN TLS ROUND 10FR (DRAIN) IMPLANT
DRAPE U-SHAPE 76X120 STRL (DRAPES) ×3 IMPLANT
ELECT COATED BLADE 2.86 ST (ELECTRODE) ×3 IMPLANT
ELECT NDL BLADE 2-5/6 (NEEDLE) IMPLANT
ELECT NEEDLE BLADE 2-5/6 (NEEDLE) IMPLANT
ELECT PAIRED SUBDERMAL (MISCELLANEOUS)
ELECT REM PT RETURN 9FT ADLT (ELECTROSURGICAL) ×3
ELECTRODE PAIRED SUBDERMAL (MISCELLANEOUS) IMPLANT
ELECTRODE REM PT RTRN 9FT ADLT (ELECTROSURGICAL) ×1 IMPLANT
EVACUATOR SILICONE 100CC (DRAIN) IMPLANT
FORCEPS BIPOLAR SPETZLER 8 1.0 (NEUROSURGERY SUPPLIES) IMPLANT
GAUZE SPONGE 4X4 12PLY STRL LF (GAUZE/BANDAGES/DRESSINGS) IMPLANT
GAUZE SPONGE 4X4 16PLY XRAY LF (GAUZE/BANDAGES/DRESSINGS) IMPLANT
GLOVE BIO SURGEON STRL SZ7.5 (GLOVE) ×3 IMPLANT
GLOVE BIOGEL PI IND STRL 7.0 (GLOVE) IMPLANT
GLOVE BIOGEL PI INDICATOR 7.0 (GLOVE) ×2
GLOVE ECLIPSE 6.5 STRL STRAW (GLOVE) ×2 IMPLANT
GLOVE EXAM NITRILE PF MED BLUE (GLOVE) ×2 IMPLANT
GLOVE SURG SS PI 7.0 STRL IVOR (GLOVE) ×2 IMPLANT
GOWN STRL REUS W/ TWL LRG LVL3 (GOWN DISPOSABLE) ×2 IMPLANT
GOWN STRL REUS W/TWL LRG LVL3 (GOWN DISPOSABLE) ×9
HEMOSTAT SURGICEL .5X2 ABSORB (HEMOSTASIS) IMPLANT
LOCATOR NERVE 3 VOLT (DISPOSABLE) IMPLANT
NDL HYPO 25X1 1.5 SAFETY (NEEDLE) ×1 IMPLANT
NDL PRECISIONGLIDE 27X1.5 (NEEDLE) IMPLANT
NEEDLE HYPO 25X1 1.5 SAFETY (NEEDLE) ×3 IMPLANT
NEEDLE PRECISIONGLIDE 27X1.5 (NEEDLE) IMPLANT
NS IRRIG 1000ML POUR BTL (IV SOLUTION) ×3 IMPLANT
PACK BASIN DAY SURGERY FS (CUSTOM PROCEDURE TRAY) ×3 IMPLANT
PAD CLEANER CAUTERY TIP 5X5 (MISCELLANEOUS)
PENCIL BUTTON HOLSTER BLD 10FT (ELECTRODE) ×3 IMPLANT
PIN SAFETY STERILE (MISCELLANEOUS) IMPLANT
PROBE NERVBE PRASS .33 (MISCELLANEOUS) IMPLANT
SHEARS HARMONIC 9CM CVD (BLADE) IMPLANT
SLEEVE SCD COMPRESS KNEE MED (MISCELLANEOUS) IMPLANT
SPONGE GAUZE 2X2 8PLY STER LF (GAUZE/BANDAGES/DRESSINGS)
SPONGE GAUZE 2X2 8PLY STRL LF (GAUZE/BANDAGES/DRESSINGS) IMPLANT
STAPLER VISISTAT 35W (STAPLE) IMPLANT
STRIP CLOSURE SKIN 1/4X4 (GAUZE/BANDAGES/DRESSINGS) IMPLANT
SUCTION FRAZIER HANDLE 10FR (MISCELLANEOUS)
SUCTION TUBE FRAZIER 10FR DISP (MISCELLANEOUS) IMPLANT
SUT ETHILON 3 0 PS 1 (SUTURE) IMPLANT
SUT ETHILON 4 0 PS 2 18 (SUTURE) IMPLANT
SUT PROLENE 5 0 P 3 (SUTURE) IMPLANT
SUT SILK 3 0 TIES 17X18 (SUTURE)
SUT SILK 3-0 18XBRD TIE BLK (SUTURE) IMPLANT
SUT SILK 4 0 TIES 17X18 (SUTURE) IMPLANT
SUT VIC AB 3-0 FS2 27 (SUTURE) IMPLANT
SUT VIC AB 4-0 P-3 18XBRD (SUTURE) IMPLANT
SUT VIC AB 4-0 P3 18 (SUTURE)
SUT VIC AB 4-0 RB1 27 (SUTURE)
SUT VIC AB 4-0 RB1 27X BRD (SUTURE) IMPLANT
SUT VICRYL 4-0 PS2 18IN ABS (SUTURE) ×3 IMPLANT
SYR BULB 3OZ (MISCELLANEOUS) IMPLANT
SYR CONTROL 10ML LL (SYRINGE) ×3 IMPLANT
TOWEL OR 17X24 6PK STRL BLUE (TOWEL DISPOSABLE) ×3 IMPLANT
TRAY DSU PREP LF (CUSTOM PROCEDURE TRAY) ×3 IMPLANT
TUBE CONNECTING 20'X1/4 (TUBING)
TUBE CONNECTING 20X1/4 (TUBING) IMPLANT

## 2018-01-05 NOTE — Anesthesia Preprocedure Evaluation (Signed)
Anesthesia Evaluation  Patient identified by MRN, date of birth, ID band Patient awake    Reviewed: Allergy & Precautions, NPO status , Patient's Chart, lab work & pertinent test results  Airway Mallampati: I  TM Distance: >3 FB Neck ROM: Full    Dental no notable dental hx.    Pulmonary Current Smoker,    Pulmonary exam normal breath sounds clear to auscultation       Cardiovascular hypertension, Pt. on medications Normal cardiovascular exam Rhythm:Regular Rate:Normal     Neuro/Psych Anxiety Depression    GI/Hepatic GERD  Medicated and Controlled,  Endo/Other    Renal/GU      Musculoskeletal   Abdominal   Peds  Hematology   Anesthesia Other Findings   Reproductive/Obstetrics                             Anesthesia Physical  Anesthesia Plan  ASA: III  Anesthesia Plan: General   Post-op Pain Management:    Induction: Intravenous  PONV Risk Score and Plan: 1 and Treatment may vary due to age or medical condition and Ondansetron  Airway Management Planned: Oral ETT and LMA  Additional Equipment:   Intra-op Plan:   Post-operative Plan: Extubation in OR  Informed Consent: I have reviewed the patients History and Physical, chart, labs and discussed the procedure including the risks, benefits and alternatives for the proposed anesthesia with the patient or authorized representative who has indicated his/her understanding and acceptance.     Plan Discussed with: CRNA, Surgeon and Anesthesiologist  Anesthesia Plan Comments: (  )        Anesthesia Quick Evaluation

## 2018-01-05 NOTE — Anesthesia Postprocedure Evaluation (Signed)
Anesthesia Post Note  Patient: James Swanson  Procedure(s) Performed: RIGHT NECK MASS BIOPSY (Right Neck)     Patient location during evaluation: PACU Anesthesia Type: General Level of consciousness: awake and alert Pain management: pain level controlled Vital Signs Assessment: post-procedure vital signs reviewed and stable Respiratory status: spontaneous breathing, nonlabored ventilation, respiratory function stable and patient connected to nasal cannula oxygen Cardiovascular status: blood pressure returned to baseline and stable Postop Assessment: no apparent nausea or vomiting Anesthetic complications: no    Last Vitals:  Vitals:   01/05/18 0916 01/05/18 1220  BP: (!) 144/78 (!) 163/83  Pulse: 88 93  Resp: 16 14  Temp: 36.4 C 36.4 C  SpO2: 96% 93%    Last Pain:  Vitals:   01/05/18 0916  TempSrc: Oral                 Bettyjean Stefanski

## 2018-01-05 NOTE — Discharge Instructions (Addendum)
The patient may resume all his previous activities and diet. He will follow-up in my office in one week. ° ° ° °Post Anesthesia Home Care Instructions ° °Activity: °Get plenty of rest for the remainder of the day. A responsible individual must stay with you for 24 hours following the procedure.  °For the next 24 hours, DO NOT: °-Drive a car °-Operate machinery °-Drink alcoholic beverages °-Take any medication unless instructed by your physician °-Make any legal decisions or sign important papers. ° °Meals: °Start with liquid foods such as gelatin or soup. Progress to regular foods as tolerated. Avoid greasy, spicy, heavy foods. If nausea and/or vomiting occur, drink only clear liquids until the nausea and/or vomiting subsides. Call your physician if vomiting continues. ° °Special Instructions/Symptoms: °Your throat may feel dry or sore from the anesthesia or the breathing tube placed in your throat during surgery. If this causes discomfort, gargle with warm salt water. The discomfort should disappear within 24 hours. ° °If you had a scopolamine patch placed behind your ear for the management of post- operative nausea and/or vomiting: ° °1. The medication in the patch is effective for 72 hours, after which it should be removed.  Wrap patch in a tissue and discard in the trash. Wash hands thoroughly with soap and water. °2. You may remove the patch earlier than 72 hours if you experience unpleasant side effects which may include dry mouth, dizziness or visual disturbances. °3. Avoid touching the patch. Wash your hands with soap and water after contact with the patch. °  ° °

## 2018-01-05 NOTE — Op Note (Signed)
DATE OF PROCEDURE:  01/05/2018                              OPERATIVE REPORT  SURGEON:  Leta Baptist, MD  PREOPERATIVE DIAGNOSES: 1. Right neck mass  POSTOPERATIVE DIAGNOSES: 1. Right neck mass  PROCEDURE PERFORMED:  Open biopsy of right cervical mass  ANESTHESIA:  General endotracheal tube anesthesia.  COMPLICATIONS:  None.  ESTIMATED BLOOD LOSS:  Minimal.  INDICATION FOR PROCEDURE:  James Swanson is a 77 y.o. male with a history of right vocal cord squamous cell carcinoma. He was treated with radiation therapy. He completed his treatment in 2016. The patient was doing well until late last year, when he noted increasing right ear pain. He was also noted to have a new right level II neck mass. A CT scan was performed. The CT showed a new 4 cm right level II lymph node. The mass has invaded into the right internal jugular vein, causing thrombosis. The findings were concerning for recurrent metastatic disease. His FNA biopsy was nondiagnostic. Based on the above findings, the decision was made for the patient to undergo the adenotonsillectomy procedure. The risks, benefits, alternatives, and details of the procedure were discussed with the patient.  Questions were invited and answered.  Informed consent was obtained.  DESCRIPTION:  The patient was taken to the operating room and placed supine on the operating table.  General endotracheal tube anesthesia was administered by the anesthesiologist.  The patient was positioned and prepped and draped in a standard fashion for right neck surgery.  1% lidocaine with 1-100,000 epinephrine was infiltrated at the planned site of incision. A 3 cm transverse incision was made over the right level II neck mass. The incision was carried down to the level of the sternocleidomastoid muscles. The sternocleidomastoid muscles were retracted laterally, exposing the large soft tissue mass. Three incisional biopsy specimens were obtained from the neck mass. The specimens  were sent to the pathology department for permanent histologic identification. The surgical site was copiously irrigated. The incision was closed in layers with 4-0 Vicryl and Dermabond.   The care of the patient was turned over to the anesthesiologist.  The patient was awakened from anesthesia without difficulty.  The patient was extubated and transferred to the recovery room in good condition.  OPERATIVE FINDINGS:  A 4 cm right neck mass, concerning for recurrent squamous cell carcinoma.   SPECIMEN:  Right NECK MASS BIOPSY SPECIMENS.   FOLLOWUP CARE:  The patient will be discharged home once awake and alert.  He will be placed on amoxicillin 875 mg p.o. b.i.d. for 5 days, and percocet for postop pain control.  The patient will follow up in my office in approximately 1 week.  Jung Yurchak W Krystian Younglove 01/05/2018 12:14 PM

## 2018-01-05 NOTE — Anesthesia Procedure Notes (Signed)
Procedure Name: Intubation Date/Time: 01/05/2018 11:43 AM Performed by: Maryella Shivers, CRNA Pre-anesthesia Checklist: Patient identified, Emergency Drugs available, Suction available and Patient being monitored Patient Re-evaluated:Patient Re-evaluated prior to induction Oxygen Delivery Method: Circle system utilized Preoxygenation: Pre-oxygenation with 100% oxygen Induction Type: IV induction Ventilation: Mask ventilation without difficulty Laryngoscope Size: Mac and 3 Grade View: Grade II Tube type: Oral Tube size: 8.0 mm Number of attempts: 1 Airway Equipment and Method: Stylet and Oral airway Placement Confirmation: ETT inserted through vocal cords under direct vision,  positive ETCO2 and breath sounds checked- equal and bilateral Secured at: 22 cm Tube secured with: Tape Dental Injury: Teeth and Oropharynx as per pre-operative assessment

## 2018-01-05 NOTE — H&P (View-Only) (Signed)
Cc: Right neck mass  HPI: The patient is a 77 year old male who returns today for his follow-up evaluation.  The patient has a history of right vocal cord squamous cell carcinoma.  He was subsequently treated with radiation therapy.  He completed his treatment in 2016.  The patient was doing well until 1 month ago, when he noted increasing right ear pain.  He also noted a new right Level 2 neck mass.  A neck CT scan was performed.  The CT showed a new 4 cm right Level 2 lymph node.  The mass has invaded into the right internal jugular vein, causing thrombosis.  It also involved the right carotid bifurcation.  However, the carotid artery is still patent.  The findings were concerning for recurrent metastatic disease.  The patient returns today complaining of persistent right ear and eye pain.  He continues to have significant hoarseness.  He was previously noted to have severe laryngeal edema.  However, no obvious lesion was noted on laryngoscopy examination.   Exam General: Communicates with difficulty, no acute distress. Head: Normocephalic, no evidence injury, no tenderness, facial buttresses intact without stepoff. Eyes: PERRL, EOMI.  No scleral icterus, conjunctivae clear. Ears: His ear canals and tympanic membranes are noted to be intact.  No acute infection is noted today.  Nose: Normal skin and external support.  Anterior rhinoscopy reveals pink mucosa over the septum and turbinates.  No lesions or polyps were seen. Oral cavity: Lips without lesions, oral mucosa moist, no masses or lesions seen. Pharynx: Clear, no erythema. Neck: A new 3 cm right Level 2 neck mass is noted. Salivary: Parotid and submandibular glands without mass. Neuro:  CN 2-12 grossly intact. Gait normal.    Assessment 1.  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   2.  His CT scan and his previous laryngoscopy exam showed no obvious recurrent primary tumor.  Plan  1.  The physical  exam findings and the CT images are reviewed with the patient.  2.  His FNA was nondiagnostic with atypical cells. 3.  Plan open incisional biopsy of the right neck mass.

## 2018-01-05 NOTE — H&P (Signed)
Cc: Right neck mass  HPI: The patient is a 77 year old male who returns today for his follow-up evaluation.  The patient has a history of right vocal cord squamous cell carcinoma.  He was subsequently treated with radiation therapy.  He completed his treatment in 2016.  The patient was doing well until 1 month ago, when he noted increasing right ear pain.  He also noted a new right Level 2 neck mass.  A neck CT scan was performed.  The CT showed a new 4 cm right Level 2 lymph node.  The mass has invaded into the right internal jugular vein, causing thrombosis.  It also involved the right carotid bifurcation.  However, the carotid artery is still patent.  The findings were concerning for recurrent metastatic disease.  The patient returns today complaining of persistent right ear and eye pain.  He continues to have significant hoarseness.  He was previously noted to have severe laryngeal edema.  However, no obvious lesion was noted on laryngoscopy examination.   Exam General: Communicates with difficulty, no acute distress. Head: Normocephalic, no evidence injury, no tenderness, facial buttresses intact without stepoff. Eyes: PERRL, EOMI.  No scleral icterus, conjunctivae clear. Ears: His ear canals and tympanic membranes are noted to be intact.  No acute infection is noted today.  Nose: Normal skin and external support.  Anterior rhinoscopy reveals pink mucosa over the septum and turbinates.  No lesions or polyps were seen. Oral cavity: Lips without lesions, oral mucosa moist, no masses or lesions seen. Pharynx: Clear, no erythema. Neck: A new 3 cm right Level 2 neck mass is noted. Salivary: Parotid and submandibular glands without mass. Neuro:  CN 2-12 grossly intact. Gait normal.    Assessment 1.  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   2.  His CT scan and his previous laryngoscopy exam showed no obvious recurrent primary tumor.  Plan  1.  The physical  exam findings and the CT images are reviewed with the patient.  2.  His FNA was nondiagnostic with atypical cells. 3.  Plan open incisional biopsy of the right neck mass.

## 2018-01-05 NOTE — Transfer of Care (Signed)
Immediate Anesthesia Transfer of Care Note  Patient: James Swanson  Procedure(s) Performed: RIGHT NECK MASS BIOPSY (Right Neck)  Patient Location: PACU  Anesthesia Type:General  Level of Consciousness: awake, alert  and oriented  Airway & Oxygen Therapy: Patient Spontanous Breathing and Patient connected to face mask oxygen  Post-op Assessment: Report given to RN and Post -op Vital signs reviewed and stable  Post vital signs: Reviewed and stable  Last Vitals:  Vitals:   01/05/18 0916 01/05/18 1220  BP: (!) 144/78 (!) 163/83  Pulse: 88 93  Resp: 16 14  Temp: 36.4 C (P) 36.4 C  SpO2: 96% 93%    Last Pain:  Vitals:   01/05/18 0916  TempSrc: Oral         Complications: No apparent anesthesia complications

## 2018-01-06 ENCOUNTER — Encounter (HOSPITAL_BASED_OUTPATIENT_CLINIC_OR_DEPARTMENT_OTHER): Payer: Self-pay | Admitting: Otolaryngology

## 2018-01-07 ENCOUNTER — Other Ambulatory Visit: Payer: Self-pay | Admitting: Radiation Oncology

## 2018-01-12 ENCOUNTER — Ambulatory Visit (INDEPENDENT_AMBULATORY_CARE_PROVIDER_SITE_OTHER): Payer: Medicare HMO | Admitting: Otolaryngology

## 2018-01-13 ENCOUNTER — Other Ambulatory Visit (INDEPENDENT_AMBULATORY_CARE_PROVIDER_SITE_OTHER): Payer: Self-pay | Admitting: Otolaryngology

## 2018-01-13 DIAGNOSIS — Z8589 Personal history of malignant neoplasm of other organs and systems: Secondary | ICD-10-CM

## 2018-01-14 ENCOUNTER — Other Ambulatory Visit: Payer: Self-pay | Admitting: *Deleted

## 2018-01-14 ENCOUNTER — Encounter: Payer: Self-pay | Admitting: Radiation Oncology

## 2018-01-14 DIAGNOSIS — C4442 Squamous cell carcinoma of skin of scalp and neck: Secondary | ICD-10-CM

## 2018-01-14 NOTE — Progress Notes (Signed)
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 Now with level II right neck recurrence.  Dr. Benjamine Mola: ENTs at tumor board today recommend panendoscopy under anesthesia to rule out new primaries - hypopharynx looked worrisome on PET. Agree w/ PET scan as you've scheduled. If you would like one of the [facility] to do this, please let us know. Otherwise, please confirm that you will be scheduling this yourself. Thanks!  Gretchen/Dr. Lebron Conners: Dr. Lebron Conners and I discussed possible neoadjuvant carbo-taxol to facilitate future resection of the nodal mass. Pt to be scheduled to see an MD at [facility] for eval. Can you share this email with the MD provider who will see him, please?  Rick: Please arrange [facility] appt w/ MD to discuss. Also please obtain his [facility] records, including DRR's and isodose lines from dosimetry for me to review at his reconsult here, which you are also kindly arranging. Both appts can be soon after his PET.  -----------------------------------  Eppie Gibson, MD

## 2018-01-14 NOTE — Progress Notes (Unsigned)
ambulatory

## 2018-01-16 ENCOUNTER — Other Ambulatory Visit: Payer: Self-pay | Admitting: Otolaryngology

## 2018-01-16 ENCOUNTER — Other Ambulatory Visit: Payer: Self-pay

## 2018-01-16 ENCOUNTER — Encounter (HOSPITAL_BASED_OUTPATIENT_CLINIC_OR_DEPARTMENT_OTHER): Payer: Self-pay | Admitting: *Deleted

## 2018-01-20 ENCOUNTER — Ambulatory Visit (HOSPITAL_BASED_OUTPATIENT_CLINIC_OR_DEPARTMENT_OTHER): Payer: Medicare HMO | Admitting: Anesthesiology

## 2018-01-20 ENCOUNTER — Ambulatory Visit (HOSPITAL_BASED_OUTPATIENT_CLINIC_OR_DEPARTMENT_OTHER)
Admission: RE | Admit: 2018-01-20 | Discharge: 2018-01-20 | Disposition: A | Discharge status: Home / Self Care | Payer: Medicare HMO | Source: Ambulatory Visit | Attending: Otolaryngology | Admitting: Otolaryngology

## 2018-01-20 ENCOUNTER — Encounter (HOSPITAL_BASED_OUTPATIENT_CLINIC_OR_DEPARTMENT_OTHER): Payer: Self-pay | Admitting: *Deleted

## 2018-01-20 ENCOUNTER — Encounter (HOSPITAL_BASED_OUTPATIENT_CLINIC_OR_DEPARTMENT_OTHER)
Admission: RE | Disposition: A | Discharge status: Home / Self Care | Payer: Self-pay | Source: Ambulatory Visit | Attending: Otolaryngology

## 2018-01-20 DIAGNOSIS — K219 Gastro-esophageal reflux disease without esophagitis: Secondary | ICD-10-CM | POA: Diagnosis not present

## 2018-01-20 DIAGNOSIS — J392 Other diseases of pharynx: Secondary | ICD-10-CM | POA: Insufficient documentation

## 2018-01-20 DIAGNOSIS — Z923 Personal history of irradiation: Secondary | ICD-10-CM | POA: Insufficient documentation

## 2018-01-20 DIAGNOSIS — F17200 Nicotine dependence, unspecified, uncomplicated: Secondary | ICD-10-CM | POA: Diagnosis not present

## 2018-01-20 DIAGNOSIS — R221 Localized swelling, mass and lump, neck: Secondary | ICD-10-CM | POA: Diagnosis present

## 2018-01-20 DIAGNOSIS — J384 Edema of larynx: Secondary | ICD-10-CM | POA: Insufficient documentation

## 2018-01-20 DIAGNOSIS — Z8521 Personal history of malignant neoplasm of larynx: Secondary | ICD-10-CM | POA: Insufficient documentation

## 2018-01-20 DIAGNOSIS — C760 Malignant neoplasm of head, face and neck: Secondary | ICD-10-CM

## 2018-01-20 DIAGNOSIS — I10 Essential (primary) hypertension: Secondary | ICD-10-CM | POA: Diagnosis not present

## 2018-01-20 HISTORY — PX: LARYNGOSCOPY AND BRONCHOSCOPY: SHX5659

## 2018-01-20 HISTORY — PX: ESOPHAGOSCOPY: SHX5534

## 2018-01-20 SURGERY — ESOPHAGOSCOPY
Anesthesia: General

## 2018-01-20 MED ORDER — SCOPOLAMINE 1 MG/3DAYS TD PT72: 1.0000 | MEDICATED_PATCH | Freq: Once | TRANSDERMAL | Status: DC | PRN

## 2018-01-20 MED ORDER — EPHEDRINE 5 MG/ML INJ
INTRAVENOUS | Status: AC
  Filled 2018-01-20: qty 10

## 2018-01-20 MED ORDER — CEFAZOLIN SODIUM-DEXTROSE 2-3 GM-%(50ML) IV SOLR
INTRAVENOUS | Status: DC | PRN
Start: 2018-01-20 — End: 2018-01-20
  Administered 2018-01-20: 2 g via INTRAVENOUS

## 2018-01-20 MED ORDER — MEPERIDINE HCL 25 MG/ML IJ SOLN: 6.2500 mg | INTRAMUSCULAR | Status: DC | PRN

## 2018-01-20 MED ORDER — LIDOCAINE 2% (20 MG/ML) 5 ML SYRINGE
INTRAMUSCULAR | Status: AC
  Filled 2018-01-20: qty 5

## 2018-01-20 MED ORDER — PROPOFOL 10 MG/ML IV BOLUS
INTRAVENOUS | Status: DC | PRN
  Administered 2018-01-20: 150 mg via INTRAVENOUS

## 2018-01-20 MED ORDER — EPINEPHRINE PF 1 MG/ML IJ SOLN
INTRAMUSCULAR | Status: DC | PRN
  Administered 2018-01-20: 1 mg

## 2018-01-20 MED ORDER — LACTATED RINGERS IV SOLN
INTRAVENOUS | Status: DC
  Administered 2018-01-20: 07:00:00 via INTRAVENOUS

## 2018-01-20 MED ORDER — PHENYLEPHRINE 40 MCG/ML (10ML) SYRINGE FOR IV PUSH (FOR BLOOD PRESSURE SUPPORT)
PREFILLED_SYRINGE | INTRAVENOUS | Status: AC
  Filled 2018-01-20: qty 10

## 2018-01-20 MED ORDER — ONDANSETRON HCL 4 MG/2ML IJ SOLN: 4.0000 mg | Freq: Once | INTRAMUSCULAR | Status: DC | PRN

## 2018-01-20 MED ORDER — HYDROCODONE-ACETAMINOPHEN 7.5-325 MG PO TABS: 1.0000 | ORAL_TABLET | Freq: Once | ORAL | Status: DC | PRN

## 2018-01-20 MED ORDER — FENTANYL CITRATE (PF) 100 MCG/2ML IJ SOLN
INTRAMUSCULAR | Status: AC
  Filled 2018-01-20: qty 2

## 2018-01-20 MED ORDER — DEXAMETHASONE SODIUM PHOSPHATE 4 MG/ML IJ SOLN
INTRAMUSCULAR | Status: DC | PRN
  Administered 2018-01-20: 8 mg via INTRAVENOUS

## 2018-01-20 MED ORDER — SUCCINYLCHOLINE CHLORIDE 200 MG/10ML IV SOSY
PREFILLED_SYRINGE | INTRAVENOUS | Status: AC
  Filled 2018-01-20: qty 10

## 2018-01-20 MED ORDER — CEFAZOLIN SODIUM-DEXTROSE 2-4 GM/100ML-% IV SOLN
INTRAVENOUS | Status: AC
Start: 2018-01-20 — End: ?
  Filled 2018-01-20: qty 100

## 2018-01-20 MED ORDER — FENTANYL CITRATE (PF) 100 MCG/2ML IJ SOLN
50.0000 ug | INTRAMUSCULAR | Status: DC | PRN
  Administered 2018-01-20: 50 ug via INTRAVENOUS

## 2018-01-20 MED ORDER — SUCCINYLCHOLINE CHLORIDE 20 MG/ML IJ SOLN
INTRAMUSCULAR | Status: DC | PRN
  Administered 2018-01-20: 120 mg via INTRAVENOUS

## 2018-01-20 MED ORDER — MIDAZOLAM HCL 2 MG/2ML IJ SOLN: 1.0000 mg | INTRAMUSCULAR | Status: DC | PRN

## 2018-01-20 MED ORDER — DEXAMETHASONE SODIUM PHOSPHATE 10 MG/ML IJ SOLN
INTRAMUSCULAR | Status: AC
  Filled 2018-01-20: qty 1

## 2018-01-20 MED ORDER — ONDANSETRON HCL 4 MG/2ML IJ SOLN
INTRAMUSCULAR | Status: AC
  Filled 2018-01-20: qty 2

## 2018-01-20 MED ORDER — ONDANSETRON HCL 4 MG/2ML IJ SOLN
INTRAMUSCULAR | Status: DC | PRN
  Administered 2018-01-20: 4 mg via INTRAVENOUS

## 2018-01-20 MED ORDER — FENTANYL CITRATE (PF) 100 MCG/2ML IJ SOLN
25.0000 ug | INTRAMUSCULAR | Status: DC | PRN
Start: 2018-01-20 — End: 2018-01-20

## 2018-01-20 SURGICAL SUPPLY — 21 items
CANISTER SUCT 1200ML W/VALVE (MISCELLANEOUS) ×3 IMPLANT
CONT SPEC 4OZ CLIKSEAL STRL BL (MISCELLANEOUS) IMPLANT
GAUZE SPONGE 4X4 12PLY STRL LF (GAUZE/BANDAGES/DRESSINGS) ×6 IMPLANT
GLOVE BIO SURGEON STRL SZ 6.5 (GLOVE) ×2 IMPLANT
GLOVE BIO SURGEON STRL SZ7.5 (GLOVE) ×3 IMPLANT
GLOVE BIO SURGEONS STRL SZ 6.5 (GLOVE) ×2
GOWN STRL REUS W/ TWL LRG LVL3 (GOWN DISPOSABLE) IMPLANT
GOWN STRL REUS W/ TWL XL LVL3 (GOWN DISPOSABLE) IMPLANT
GOWN STRL REUS W/TWL LRG LVL3 (GOWN DISPOSABLE)
GOWN STRL REUS W/TWL XL LVL3 (GOWN DISPOSABLE)
GUARD TEETH (MISCELLANEOUS) IMPLANT
MARKER SKIN DUAL TIP RULER LAB (MISCELLANEOUS) IMPLANT
NS IRRIG 1000ML POUR BTL (IV SOLUTION) ×3 IMPLANT
PACK BASIN DAY SURGERY FS (CUSTOM PROCEDURE TRAY) ×3 IMPLANT
SHEET MEDIUM DRAPE 40X70 STRL (DRAPES) ×3 IMPLANT
SLEEVE SCD COMPRESS KNEE MED (MISCELLANEOUS) IMPLANT
SOLUTION BUTLER CLEAR DIP (MISCELLANEOUS) ×3 IMPLANT
SURGILUBE 2OZ TUBE FLIPTOP (MISCELLANEOUS) ×6 IMPLANT
TOWEL OR 17X24 6PK STRL BLUE (TOWEL DISPOSABLE) ×3 IMPLANT
TUBE CONNECTING 20'X1/4 (TUBING) ×1
TUBE CONNECTING 20X1/4 (TUBING) ×2 IMPLANT

## 2018-01-20 NOTE — Transfer of Care (Signed)
Immediate Anesthesia Transfer of Care Note  Patient: James Swanson  Procedure(s) Performed: ESOPHAGOSCOPY (N/A ) DIRECT LARYNGOSCOPY WITH BIOPSY AND BRONCHOSCOPY (N/A )  Patient Location: PACU  Anesthesia Type:General  Level of Consciousness: awake, alert  and oriented  Airway & Oxygen Therapy: Patient Spontanous Breathing and Patient connected to face mask oxygen  Post-op Assessment: Report given to RN and Post -op Vital signs reviewed and stable  Post vital signs: Reviewed and stable  Last Vitals:  Vitals:   01/20/18 0656 01/20/18 0846  BP:  (!) 152/81  Pulse:  81  Resp:  13  Temp: 36.8 C   SpO2:  94%    Last Pain:  Vitals:   01/20/18 0656  TempSrc: Oral  PainSc: 10-Worst pain ever         Complications: No apparent anesthesia complications

## 2018-01-20 NOTE — Anesthesia Preprocedure Evaluation (Signed)
Anesthesia Evaluation  Patient identified by MRN, date of birth, ID band Patient awake    Reviewed: Allergy & Precautions, NPO status , Patient's Chart, lab work & pertinent test results  Airway Mallampati: I  TM Distance: >3 FB Neck ROM: Full    Dental  (+) Edentulous Upper, Edentulous Lower   Pulmonary Current Smoker,  Squamous cell Ca right VC   Pulmonary exam normal breath sounds clear to auscultation       Cardiovascular hypertension, Pt. on medications Normal cardiovascular exam Rhythm:Regular Rate:Normal     Neuro/Psych PSYCHIATRIC DISORDERS Anxiety Depression negative neurological ROS     GI/Hepatic Neg liver ROS, GERD  Medicated and Controlled,  Endo/Other  Hyperlipidemia  Renal/GU negative Renal ROS  negative genitourinary   Musculoskeletal  (+) Arthritis , Osteoarthritis,    Abdominal   Peds  Hematology negative hematology ROS (+)   Anesthesia Other Findings   Reproductive/Obstetrics                             Anesthesia Physical Anesthesia Plan  ASA: III  Anesthesia Plan: General   Post-op Pain Management:    Induction: Intravenous  PONV Risk Score and Plan: 3 and Ondansetron, Dexamethasone, Midazolam and Treatment may vary due to age or medical condition  Airway Management Planned: Oral ETT  Additional Equipment:   Intra-op Plan:   Post-operative Plan: Extubation in OR  Informed Consent: I have reviewed the patients History and Physical, chart, labs and discussed the procedure including the risks, benefits and alternatives for the proposed anesthesia with the patient or authorized representative who has indicated his/her understanding and acceptance.   Dental advisory given  Plan Discussed with: CRNA, Anesthesiologist and Surgeon  Anesthesia Plan Comments:         Anesthesia Quick Evaluation

## 2018-01-20 NOTE — Anesthesia Procedure Notes (Addendum)
Procedure Name: Intubation Date/Time: 01/20/2018 8:17 AM Performed by: Willa Frater, CRNA Pre-anesthesia Checklist: Patient identified, Emergency Drugs available, Suction available and Patient being monitored Patient Re-evaluated:Patient Re-evaluated prior to induction Oxygen Delivery Method: Circle system utilized Preoxygenation: Pre-oxygenation with 100% oxygen Induction Type: IV induction Ventilation: Mask ventilation without difficulty Laryngoscope Size: Mac and 3 Grade View: Grade II Tube type: Oral Number of attempts: 1 Airway Equipment and Method: Stylet and Oral airway Placement Confirmation: ETT inserted through vocal cords under direct vision,  positive ETCO2 and breath sounds checked- equal and bilateral Secured at: 21 cm Tube secured with: Tape Dental Injury: Teeth and Oropharynx as per pre-operative assessment

## 2018-01-20 NOTE — Addendum Note (Signed)
Addendum  created 01/20/18 0953 by Willa Frater, CRNA   Intraprocedure Meds edited

## 2018-01-20 NOTE — Interval H&P Note (Signed)
History and Physical Interval Note:  01/20/2018 7:08 AM  James Swanson  has presented today for surgery, with the diagnosis of [place]  The various methods of treatment have been discussed with the patient and family. After consideration of risks, benefits and other options for treatment, the patient has consented to  Procedure(s): ESOPHAGOSCOPY (N/A) DIRECT LARYNGOSCOPY WITH BIOPSY AND BRONCHOSCOPY (N/A) as a surgical intervention .  The patient's history has been reviewed, patient examined, no change in status, stable for surgery.  I have reviewed the patient's chart and labs.  Questions were answered to the patient's satisfaction.     Illene Sweeting Cassie Freer

## 2018-01-20 NOTE — Discharge Instructions (Signed)
°  Post Anesthesia Home Care Instructions ° °Activity: °Get plenty of rest for the remainder of the day. A responsible individual must stay with you for 24 hours following the procedure.  °For the next 24 hours, DO NOT: °-Drive a car °-Operate machinery °-Drink alcoholic beverages °-Take any medication unless instructed by your physician °-Make any legal decisions or sign important papers. ° °Meals: °Start with liquid foods such as gelatin or soup. Progress to regular foods as tolerated. Avoid greasy, spicy, heavy foods. If nausea and/or vomiting occur, drink only clear liquids until the nausea and/or vomiting subsides. Call your physician if vomiting continues. ° °Special Instructions/Symptoms: °Your throat may feel dry or sore from the anesthesia or the breathing tube placed in your throat during surgery. If this causes discomfort, gargle with warm salt water. The discomfort should disappear within 24 hours. ° °If you had a scopolamine patch placed behind your ear for the management of post- operative nausea and/or vomiting: ° °1. The medication in the patch is effective for 72 hours, after which it should be removed.  Wrap patch in a tissue and discard in the trash. Wash hands thoroughly with soap and water. °2. You may remove the patch earlier than 72 hours if you experience unpleasant side effects which may include dry mouth, dizziness or visual disturbances. °3. Avoid touching the patch. Wash your hands with soap and water after contact with the patch. °  °-------------- ° °The patient may resume all his previous activities and diet. °

## 2018-01-20 NOTE — Anesthesia Postprocedure Evaluation (Signed)
Anesthesia Post Note  Patient: James Swanson  Procedure(s) Performed: ESOPHAGOSCOPY (N/A ) DIRECT LARYNGOSCOPY WITH BIOPSY AND BRONCHOSCOPY (N/A )     Patient location during evaluation: PACU Anesthesia Type: General Level of consciousness: awake and alert and oriented Pain management: pain level controlled Vital Signs Assessment: post-procedure vital signs reviewed and stable Respiratory status: spontaneous breathing, nonlabored ventilation and respiratory function stable Cardiovascular status: blood pressure returned to baseline and stable Postop Assessment: no apparent nausea or vomiting Anesthetic complications: no    Last Vitals:  Vitals:   01/20/18 0912 01/20/18 0915  BP: (!) 171/75 (!) 157/82  Pulse: 88 86  Resp: 16 18  Temp:    SpO2: 96% 98%    Last Pain:  Vitals:   01/20/18 0912  TempSrc:   PainSc: 0-No pain                 Jaye Saal A.

## 2018-01-20 NOTE — Op Note (Signed)
DATE OF PROCEDURE:  01/20/2018                              OPERATIVE REPORT  SURGEON:  Leta Baptist, MD  PREOPERATIVE DIAGNOSES: 1. 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 No obvious recurrent lesion noted at the primary site.  PROCEDURE PERFORMED:   1. Direct laryngoscopy with biopsy 2. Transoral rigid esophagoscopy 3. Flexible bronchoscopy  ANESTHESIA:  General endotracheal tube anesthesia.  COMPLICATIONS:  None.  ESTIMATED BLOOD LOSS:  Minimal.  INDICATION FOR PROCEDURE:  James Swanson is a 77 y.o. male with a history of a T2 N0 right vocal cords, cell carcinoma. He was treated with radiation therapy. He completed history May 2016. He was doing well until 3 months ago, when she noted increasing right ear pain. He was noted to have a right level II neck mass. The CT scan showed a 4 cm right level II lymph node. Biopsy of the lymph node was consistent with squamous cell carcinoma. His initial CT and his fiberoptic laryngoscopy examination showed no obvious recurrent primary tumor. However the radiologist recently noted that the patient has an asymmetric appearance of the right hypopharynx. Panendoscopy and biopsy was therefore requested. The risks, benefits, alternatives, and details of the procedure were discussed with the patient.  Questions were invited and answered.  Informed consent was obtained.  DESCRIPTION:  The patient was taken to the operating room and placed supine on the operating table.  General endotracheal tube anesthesia was administered by the anesthesiologist.  The patient was positioned and prepped and draped in a standard fashion for panendoscopy.  A Dedo laryngoscope was inserted via the oral cavity into the pharynx. The patient's pharynx and larynx were diffusely edematous. However, no suspicious mass or lesion was noted. The vallecula, epiglottis,  aryepiglottic folds, piriform sinuses, and true and false vocal cords were all grossly normal. The mucosal edema was worse on the right side. The Dedo laryngoscope was suspended. 3 biopsy specimens were obtained from the right hypopharynx / piriform sinus.  The Dedo laryngoscope was withdrawn. A rigid esophagoscope was inserted via the oral cavity into the esophageal inlet. It was advanced into the esophageal lumen. No suspicious mass or lesion was noted. The rigid esophagoscope was withdrawn.  A flexible bronchoscope was then inserted through the endotracheal tube into the trachea. The trachea, carina, bilateral mainstem bronchi, and segmental takeoffs were all within normal limits. No suspicious mass or lesion was noted. The bronchoscope was withdrawn.     The care of the patient was turned over to the anesthesiologist.  The patient was awakened from anesthesia without difficulty.  The patient was extubated and transferred to the recovery room in good condition.  OPERATIVE FINDINGS:  Diffuse pharyngeal and laryngeal edema, worse on the right site. No suspicious mass or lesion was noted.   SPECIMEN:  Right hypopharyngeal and piriform sinus biopsy specimens.   FOLLOWUP CARE:  The patient will be discharged home once awake and alert.   Cooper Stamp W Avedis Bevis 01/20/2018 8:55 AM

## 2018-01-21 ENCOUNTER — Ambulatory Visit (HOSPITAL_COMMUNITY): Payer: Medicare HMO | Admitting: Internal Medicine

## 2018-01-22 ENCOUNTER — Encounter (HOSPITAL_BASED_OUTPATIENT_CLINIC_OR_DEPARTMENT_OTHER): Payer: Self-pay | Admitting: Otolaryngology

## 2018-01-23 ENCOUNTER — Ambulatory Visit (HOSPITAL_COMMUNITY)
Admission: RE | Admit: 2018-01-23 | Discharge: 2018-01-23 | Disposition: A | Discharge status: Home / Self Care | Payer: Medicare HMO | Source: Ambulatory Visit | Attending: Otolaryngology | Admitting: Otolaryngology

## 2018-01-23 ENCOUNTER — Encounter: Payer: Self-pay | Admitting: Radiation Oncology

## 2018-01-23 ENCOUNTER — Telehealth: Payer: Self-pay | Admitting: *Deleted

## 2018-01-23 DIAGNOSIS — R590 Localized enlarged lymph nodes: Secondary | ICD-10-CM | POA: Insufficient documentation

## 2018-01-23 DIAGNOSIS — C760 Malignant neoplasm of head, face and neck: Secondary | ICD-10-CM | POA: Diagnosis not present

## 2018-01-23 DIAGNOSIS — Z8589 Personal history of malignant neoplasm of other organs and systems: Secondary | ICD-10-CM

## 2018-01-23 LAB — GLUCOSE, CAPILLARY: Glucose-Capillary: 87 mg/dL (ref 65–99)

## 2018-01-23 MED ORDER — FLUDEOXYGLUCOSE F - 18 (FDG) INJECTION
9.4000 | Freq: Once | INTRAVENOUS | Status: AC | PRN
  Administered 2018-01-23: 9.4 via INTRAVENOUS

## 2018-01-23 NOTE — Telephone Encounter (Signed)
Oncology Nurse Navigator Documentation  Spoke with pt's wife and later dtr.  Informed of 1/29 10:30 NE/11:00 Dr. Isidore Moos appts.  Dtr confirmed understanding of [place] location, I explained registration procedure.  Also requested that James Swanson arrive to [name] to sign information release form so RT tmt records from tmt rec'd by Dr. Isidore Moos couple of years ago can be forwarded to [facility] for her review.  Dtr indicated she would take him by today.  James Orem, RN, BSN Head & Neck Oncology Nurse [place] at [place] [phone]

## 2018-01-23 NOTE — Progress Notes (Signed)
Head and Neck Cancer Location of Tumor / Histology:  Right neck soft tissue mass biopsy revealed squamous cell carcinoma.   Patient presented with symptoms of: Dr. Deeann Saint note 01/05/18:  The patient is a 77 year old male who returns today for his follow-up evaluation.  The patient has a history of right vocal cord squamous cell carcinoma.  He was subsequently treated with radiation therapy.  He completed his treatment in 2016.  The patient was doing well until 1 month ago, when he noted increasing right ear pain.  He also noted a new right Level 2 neck mass.  Biopsies revealed:  01/05/18 Diagnosis Soft tissue mass, biopsy, Right Neck - SQUMAOUS CELL CARCINOMA, KERATINIZING  01/20/18 Diagnosis Pharynx, biopsy, right hypopharyngeal - FOCAL LOW GRADE SQUAMOUS DYSPLASIA - NO CARCINOMA IDENTIFIED  Nutrition Status Yes No Comments  Weight changes? _0  _1  He has lost about 12 lbs since November.   Swallowing concerns? _2  _3  He has pain when swallowing. He is only able to eat softer foods, and needs to chew his food well.   PEG? _4  _5     Referrals Yes No Comments  Social Work? _6  _7  Will place referral today for a distress screening score of 8  Dentistry? _8  _9  He does not have teeth  Swallowing therapy? _10  _11    Nutrition? _12  _13    Med/Onc? _14  _15  Dr. Sherrine Maples at [facility] 01/28/18   Safety Issues Yes No Comments  Prior radiation? _16  _17  11/28/2015- 01/10/2016 Larynx 65.25. Gy in 29 fractions.   Pacemaker/ICD? _18  _19    Possible current pregnancy? _20  _21    Is the patient on methotrexate? _22  _23     Tobacco/Marijuana/Snuff/ETOH use: He is a current smoker. He smokes 2 packs of cigarettes daily.   Past/Anticipated interventions by otolaryngology, if any:  01/05/18 Dr. Benjamine Mola PROCEDURE PERFORMED:  Open biopsy of right cervical mass  01/20/18 PROCEDURE PERFORMED:   1. Direct laryngoscopy with biopsy 2. Transoral rigid esophagoscopy 3. Flexible bronchoscopy  POSTOPERATIVE DIAGNOSES: 1.  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 No obvious recurrent lesion noted at the primary site.   Past/Anticipated interventions by medical oncology, if any: 01/28/18 Scheduled to see Dr. Sherrine Maples at [facility] His daughter had planned to cancel this appointment, but I have encouraged her to listen to Dr. Pearlie Oyster recommendations today.    Current Complaints / other details:   He reports pain and numbness to his right ear. He is taking hydrocodone every 6 hours which was originally prescribed by his PCP PET 01/23/18  BP 119/60   Pulse 85   Temp 98.4 F (36.9 C)   Ht _24  (1.753 m)   Wt 188 lb (85.3 kg)   SpO2 95% Comment: room air  BMI 27.76 kg/m    Wt Readings from Last 3 Encounters:  01/27/18 188 lb (85.3 kg)  01/20/18 190 lb 8 oz (86.4 kg)  01/05/18 195 lb (88.5 kg)

## 2018-01-27 ENCOUNTER — Encounter: Payer: Self-pay | Admitting: Radiation Oncology

## 2018-01-27 ENCOUNTER — Ambulatory Visit
Admission: RE | Admit: 2018-01-27 | Discharge: 2018-01-27 | Disposition: A | Discharge status: Home / Self Care | Payer: Medicare HMO | Source: Ambulatory Visit | Attending: Radiation Oncology | Admitting: Radiation Oncology

## 2018-01-27 ENCOUNTER — Encounter: Payer: Self-pay | Admitting: General Practice

## 2018-01-27 ENCOUNTER — Other Ambulatory Visit: Payer: Self-pay | Admitting: Radiation Oncology

## 2018-01-27 VITALS — BP 119/60 | HR 85 | Temp 98.4°F | Ht 69.0 in | Wt 188.0 lb

## 2018-01-27 DIAGNOSIS — F419 Anxiety disorder, unspecified: Secondary | ICD-10-CM | POA: Diagnosis not present

## 2018-01-27 DIAGNOSIS — F17200 Nicotine dependence, unspecified, uncomplicated: Secondary | ICD-10-CM | POA: Diagnosis not present

## 2018-01-27 DIAGNOSIS — C320 Malignant neoplasm of glottis: Secondary | ICD-10-CM

## 2018-01-27 DIAGNOSIS — Z79899 Other long term (current) drug therapy: Secondary | ICD-10-CM | POA: Insufficient documentation

## 2018-01-27 DIAGNOSIS — K219 Gastro-esophageal reflux disease without esophagitis: Secondary | ICD-10-CM | POA: Insufficient documentation

## 2018-01-27 DIAGNOSIS — F329 Major depressive disorder, single episode, unspecified: Secondary | ICD-10-CM | POA: Diagnosis not present

## 2018-01-27 DIAGNOSIS — I10 Essential (primary) hypertension: Secondary | ICD-10-CM | POA: Insufficient documentation

## 2018-01-27 DIAGNOSIS — G8929 Other chronic pain: Secondary | ICD-10-CM | POA: Insufficient documentation

## 2018-01-27 DIAGNOSIS — Z923 Personal history of irradiation: Secondary | ICD-10-CM | POA: Diagnosis not present

## 2018-01-27 DIAGNOSIS — Z886 Allergy status to analgesic agent status: Secondary | ICD-10-CM | POA: Insufficient documentation

## 2018-01-27 DIAGNOSIS — Z7982 Long term (current) use of aspirin: Secondary | ICD-10-CM | POA: Diagnosis not present

## 2018-01-27 NOTE — Progress Notes (Signed)
Radiation Oncology         [phone] ________________________________  Outpatient Re-Consultation  Name: James Swanson MRN: [id number]  Date: 01/27/2018  DOB: [birth year]  KK:XFGHWE, Hollice Espy, MD  Leta Baptist, MD   REFERRING PHYSICIAN: Leta Baptist, MD  DIAGNOSIS:    ICD-10-CM   1. 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  He completed radiation to the larynx, 65.25 Gy in 29 fractions on 01/10/2016 with a complete response.  I no longer work in that clinic but he has continued follow-up in [facility] with his other oncologists  Patient did relatively well until the end of 2018 when he noticed increasing ear pain.  He also noticed a new right level 2 neck mass.  CT scan of the neck revealed, on January 14, 2018, a 4.1 cm lobulated right neck soft tissue mass consistent with a malignant lymph node conglomeration with extracapsular extension.  It extends from level 2 to level 3.  There was involvement of the right carotid space and the right sternocleidomastoid muscle as well as invasion and occlusion of the right internal jugular vein.  The right carotid remains patent.  No obvious primary lesion.  Subsequently, the patient saw Dr. Benjamine Mola who performed open biopsy of right  cervical mass on 01/05/2018.  Pathology revealed: Keratinizing squamous cell carcinoma  Patient was discussed at tumor board.  There was question as to whether his CT imaging showed a hypopharyngeal mass.  Examination under anesthesia was recommended for primary  On 01/20/2018 Dr. Benjamine Mola took the patient to the OR for examination under anesthesia.  Laryngoscopy and bronchoscopy were performed.  No suspicious masses or lesions were noted in the pharynx larynx or trachea or bronchi.  Right hypopharyngeal and piriform sinus biopsies were obtained -pathology revealed focal low-grade squamous dysplasia with no carcinoma  On 01/23/2018 he underwent pet imaging; this revealed a 2.8 x 4.6 right level 2/3 nodal mass in the right carotid space and additional suspected 8 mm right level 3 nodal metastasis.  No findings specific for metastatic disease in the chest abdomen or pelvis.  No evidence of activity in the skeleton.  He has an appointment tomorrow with medical oncology at [facility]  Swallowing issues, if any: Able to swallow softer foods.  Does endorse odynophagia  Weight Changes: He is lost 12 pounds since November  Pain status: Right ear pain, pain swallowing  Other symptoms: Anxiety  Tobacco history, if any: Continues to smoke 2 packs a day  PREVIOUS RADIATION THERAPY: Yes 65.25 Gy /29 Fx to Larynx with opposed lateral fields in [place], [place] at [facility] by me, completed 01-10-16. Lower aspect of nodal conglomerate overlaps previous radiation exposure.  PAST MEDICAL HISTORY:  has a past medical history of Anxiety,  Arthritis, Chronic back pain, Depression, GERD (gastroesophageal reflux disease), Heavy cigarette smoker, Hypertension, Squamous cell carcinoma of vocal cord ([place]) (10/30/2015), and Vocal cord mass.    PAST SURGICAL HISTORY: Past Surgical History:  Procedure Laterality Date  . ESOPHAGOSCOPY N/A 01/20/2018   Procedure: ESOPHAGOSCOPY;  Surgeon: Leta Baptist, MD;  Location: [place];  Service: ENT;  Laterality: N/A;  . HERNIA REPAIR Right   . LARYNGOSCOPY AND BRONCHOSCOPY N/A 01/20/2018   Procedure: DIRECT LARYNGOSCOPY WITH BIOPSY AND BRONCHOSCOPY;  Surgeon: Leta Baptist, MD;  Location: [place];  Service: ENT;  Laterality: N/A;  . MASS BIOPSY Right 01/05/2018   Procedure: RIGHT NECK MASS BIOPSY;  Surgeon: Leta Baptist, MD;  Location: [place];  Service: ENT;  Laterality: Right;  . MICROLARYNGOSCOPY Right 10/24/2015   Procedure: MICRO DIRECT LARYNGOSCOPY WITH VOCAL CORD BIOPSY ;  Surgeon: Leta Baptist, MD;  Location: [place];  Service: ENT;  Laterality: Right;  . TYMPANOPLASTY Right     FAMILY HISTORY: Family history is unknown by patient.  SOCIAL HISTORY:  reports that he has been smoking.  He has a 120.00 pack-year smoking history. he has never used smokeless tobacco. He reports that he does not drink alcohol or use drugs.  ALLERGIES: Asa [aspirin]  MEDICATIONS:  Current Outpatient Medications  Medication Sig Dispense Refill  . acetaminophen (TYLENOL) 500 MG tablet Take 1,000 mg by mouth every 6 (six) hours as needed.    Marland Kitchen amLODipine (NORVASC) 5 MG tablet TAKE ONE TABLET BY MOUTH ONCE DAILY    . aspirin 81 MG chewable tablet Chew by mouth.    Marland Kitchen atorvastatin (LIPITOR) 40 MG tablet Take 40 mg by mouth daily.    Marland Kitchen HYDROcodone-acetaminophen (NORCO/VICODIN) 5-325 MG tablet Take 1 tablet by mouth every 6 (six) hours as needed for moderate pain.    . nitroGLYCERIN (NITROSTAT) 0.4 MG SL tablet DISSOLVE ONE TABLET UNDER THE TONGUE EVERY 5 MINUTES AS NEEDED FOR CHEST PAIN.  DO NOT EXCEED A TOTAL OF 3 DOSES IN 15 MINUTES    . pantoprazole (PROTONIX) 40 MG tablet Take 40 mg by mouth daily.    . meloxicam (MOBIC) 7.5 MG tablet Take 7.5 mg by mouth 2 (two) times daily.     No current facility-administered medications for this encounter.     REVIEW OF SYSTEMS:  A 10+ POINT REVIEW OF SYSTEMS WAS OBTAINED including  neurology,  psychiatry, cardiac, respiratory, lymph, extremities, GI, GU, Musculoskeletal, constitutional, HEENT.  All pertinent positives are noted in the HPI.  All others are negative.   PHYSICAL EXAM:  height is 5\' 9"  (1.753 m) and weight is 188 lb (85.3 kg). His temperature is 98.4 F (36.9 C). His blood pressure is 119/60 and his pulse is 85. His oxygen saturation is 95%.   General: Alert and oriented, in no acute distress HEENT: Head is normocephalic. Extraocular movements are intact. Oropharynx is notable for no lesions or teeth. Ears: significant cerumen b/l Neck: Neck is notable for large mass from level II to III of right neck, at least 4-5 cm in dimension Heart: Regular in rate and rhythm with no murmurs, rubs, or gallops. Chest: Clear to auscultation bilaterally, with no rhonchi, wheezes, or rales. Abdomen: Soft, nontender, nondistended, with no rigidity or guarding. Extremities: No cyanosis or edema. Lymphatics: see Neck Exam Skin: No concerning lesions. No skin recurrence through scar of right neck today. Musculoskeletal: symmetric strength and muscle tone throughout. Neurologic: Cranial nerves II through XII are  grossly intact. No obvious focalities. Speech is fluent. Coordination is intact. Uses a cane Psychiatric: Judgment and insight are intact. Affect is appropriate.   ECOG = 2  0 - Asymptomatic (Fully active, able to carry on all predisease activities without restriction)  1 - Symptomatic but completely ambulatory (Restricted in physically strenuous activity but ambulatory and able to carry out work of a light or sedentary nature. For example, light housework, office work)  2 - Symptomatic, <50% in bed during the day (Ambulatory and capable of all self care but unable to carry out any work activities. Up and about more than 50% of waking hours)  3 - Symptomatic, >50% in bed, but not bedbound (Capable of only limited self-care, confined to bed or chair 50% or more of  waking hours)  4 - Bedbound (Completely disabled. Cannot carry on any self-care. Totally confined to bed or chair)  5 - Death   Eustace Pen MM, Creech RH, Tormey DC, et al. [phone]). "Toxicity and response criteria of the [facility]". [place] Oncol. 5 (6): 649-55   LABORATORY DATA:  Lab Results  Component Value Date   WBC 5.5 08/27/2016   HGB 14.0 08/27/2016   HCT 41.3 08/27/2016   MCV 90.2 08/27/2016   PLT 143 (L) 08/27/2016   CMP     Component Value Date/Time   NA 139 08/27/2016 0918   K 4.5 08/27/2016 0918   CL 105 08/27/2016 0918   CO2 25 08/27/2016 0918   GLUCOSE 107 (H) 08/27/2016 0918   BUN 17 08/27/2016 0918   CREATININE 1.00 11/21/2017 1002   CALCIUM 9.3 08/27/2016 0918   PROT 7.1 08/27/2016 0918   ALBUMIN 4.3 08/27/2016 0918   AST 12 (L) 08/27/2016 0918   ALT 11 (L) 08/27/2016 0918   ALKPHOS 28 (L) 08/27/2016 0918   BILITOT 1.2 08/27/2016 0918   GFRNONAA >60 08/27/2016 0918   GFRAA >60 08/27/2016 0918        RADIOGRAPHY: I review all images above in HPI   Nm Pet Image Initial (pi) Skull Base To Thigh  Result Date: 01/23/2018 CLINICAL DATA:  Initial treatment strategy for laryngeal cancer. History of vocal cord cancer in 2016. EXAM: NUCLEAR MEDICINE PET SKULL BASE TO THIGH TECHNIQUE: 9.4 mCi F-18 FDG was injected intravenously. Full-ring PET imaging was performed from the skull base to thigh after the radiotracer. CT data was obtained and used for attenuation correction and anatomic localization. FASTING BLOOD GLUCOSE:  Value: 87 mg/dl COMPARISON:  CT neck dated 11/21/2017 FINDINGS: NECK: 2.8 x 4.6 cm right level II/III nodal mass (series 4/image 32), in the right carotid space, max SUV 14.5. Additional 8 mm short axis right level III node (series 4/image 44), max SUV 7.7. Mild asymmetric hypermetabolism involving the right submandibular gland (series 4/image 44), max SUV 3.6 cm, nonspecific. CHEST: No hypermetabolic pulmonary nodules. Mild  dependent atelectasis in the bilateral lower lobes, right greater than left. No hypermetabolic thoracic lymphadenopathy. The heart is normal in size. No pericardial effusion. No evidence of thoracic aortic aneurysm. Mild atherosclerotic calcifications aortic arch. Three vessel coronary atherosclerosis. ABDOMEN/PELVIS: No abnormal hypermetabolism in the liver, spleen, pancreas, or adrenal glands. No hypermetabolic abdominopelvic lymphadenopathy. Scattered hepatic granulomata. Mild low-density thickening of the bilateral adrenal glands. Atherosclerotic calcifications the abdominal aorta and branch vessels. Sigmoid diverticulosis, without evidence of diverticulitis. SKELETON: No focal hypermetabolic activity to suggest skeletal metastasis. Mild hypermetabolism involving the left shoulder and right hip, degenerative. IMPRESSION: 2.8 x 4.6 cm right level II/III  nodal mass in the right carotid space, worrisome for nodal metastasis. Additional suspected 8 mm right level III nodal metastasis. No findings specific for metastatic disease in the chest, abdomen, or pelvis. Electronically Signed   By: Julian Hy M.D.   On: 01/23/2018 10:52      IMPRESSION/PLAN: This is a delightful patient with recurrent head and neck cancer.  Luckily the PET scan does not show metastatic disease.  However the disease in the right neck nodes is extensive with significant extracapsular extension, wrapping around vessels.  I discussed the patient's case with one of my colleagues at [facility] in [place].  We will send the images to him so he can weigh in on whether surgical resection should be attempted.  He and I also discussed the possibility of induction therapy such as with chemotherapy if this might facilitate a cleaner resection.  He is not sure this will be ideal, but it is within the realm of options to consider.  I discussed with my surgical colleagues, as well as my colleagues at tumor board earlier this month,  as well as with the patient, that the inferior aspect of his nodal disease dips down into his prior radiation fields.  This would increase the risks of radiation as some tissue would need to be re-irradiated.  The advantage of surgery is that it could allow me to give adjuvant radiation at a lower dose than what would be needed for definitive treatment, and it might possibly increase his chance of local control and cure.   I discussed with the patient that if surgery is not a good option for him we may need to consider concurrent chemotherapy and radiation.  However we need to see if he is a chemotherapy candidate and he has a medical oncology appointment pending tomorrow.  I am hopeful that he will be a candidate for chemotherapy.  The patient is open minded about options and understandably would like to start treatment as soon as possible  I asked the patient today about tobacco use. The patient uses tobacco.  I advised the patient to quit. Services were offered by me today including outpatient counseling and pharmacotherapy. I assessed for the willingness to attempt to quit and provided encouragement and demonstrated willingness to make referrals and/or prescriptions to help the patient attempt to quit. The patient has follow-up with the oncologic team to touch base on their tobacco use and /or cessation efforts.  Unfortunately he does not seem very motivated to quit at this time.  He understands that continuing to smoke will impair his prognosis significantly  Another logistic issue is the fact that the patient lives most more much closer to [facility] in [facility].  He would like to receive his treatment there which is where he received his treatment in the past.  Once I discern his disposition with ENT at [facility], I will make a referral to the radiation oncology department at the cancer center in [facility].  I gave the patient my business card and asked him to call me if  he does not hear from me by tomorrow afternoon.  Hopefully I will speak with my surgical colleague at [facility] by then. Gayleen Orem, RN, our Head and Neck Oncology Navigator continue to help with navigation until a treatment plan is in place. __________________________________________   Eppie Gibson, MD

## 2018-01-27 NOTE — Progress Notes (Signed)
[place] CSW Progress Note  Call to patient to review distress screen, daughter answered as patient was not feeling well, requested CSW call back tomorrow.  Edwyna Shell, LCSW Clinical Social Worker Phone:  [phone]

## 2018-01-28 ENCOUNTER — Telehealth: Payer: Self-pay | Admitting: Radiation Oncology

## 2018-01-28 ENCOUNTER — Encounter (HOSPITAL_COMMUNITY): Payer: Self-pay | Admitting: Internal Medicine

## 2018-01-28 ENCOUNTER — Encounter: Payer: Self-pay | Admitting: General Practice

## 2018-01-28 ENCOUNTER — Inpatient Hospital Stay (HOSPITAL_COMMUNITY): Payer: Medicare HMO | Attending: Internal Medicine | Admitting: Internal Medicine

## 2018-01-28 VITALS — BP 124/85 | HR 80 | Temp 98.0°F | Resp 20 | Wt 187.7 lb

## 2018-01-28 DIAGNOSIS — J449 Chronic obstructive pulmonary disease, unspecified: Secondary | ICD-10-CM

## 2018-01-28 DIAGNOSIS — C329 Malignant neoplasm of larynx, unspecified: Secondary | ICD-10-CM | POA: Diagnosis present

## 2018-01-28 DIAGNOSIS — R1310 Dysphagia, unspecified: Secondary | ICD-10-CM | POA: Diagnosis not present

## 2018-01-28 DIAGNOSIS — F17210 Nicotine dependence, cigarettes, uncomplicated: Secondary | ICD-10-CM | POA: Diagnosis not present

## 2018-01-28 DIAGNOSIS — I10 Essential (primary) hypertension: Secondary | ICD-10-CM | POA: Diagnosis not present

## 2018-01-28 DIAGNOSIS — C320 Malignant neoplasm of glottis: Secondary | ICD-10-CM

## 2018-01-28 DIAGNOSIS — R52 Pain, unspecified: Secondary | ICD-10-CM | POA: Diagnosis not present

## 2018-01-28 NOTE — Telephone Encounter (Signed)
I spoke with Dr. Conley Canal about the patient's case after he reviewed his images.  He recommends that the patient see him for a consultation to evaluate the feasibility of surgery. Gayleen Orem, RN, our Head and Neck Oncology Navigator will navigate accordingly and also will set up a reconsultation with the radiation oncology team at [facility] in [facility] so that the patient is in the loop with that team to start radiation as soon as necessary and appropriate, pending surgical opinion.  I called Mr. Helwig and updated him on the plan  He expressed appreciation for the call.  I also made a call and spoke with [place], the radiation oncology nurse at [facility], to update her on the patient and complexities of his case.  She knows him well from his prior treatments there and will inform the radiation oncologist when he returns tomorrow. -----------------------------------  Eppie Gibson, MD

## 2018-01-28 NOTE — Progress Notes (Signed)
[place] Psychosocial Distress Screening Clinical Social Work  Clinical Social Work was referred by distress screening protocol.  The patient scored a 8 on the Psychosocial Distress Thermometer which indicates moderate distress. Clinical Social Worker Edwyna Shell to assess for distress and other psychosocial needs. CSW and wife (patient unavailable due to MD appt today) discussed common feeling and emotions when being diagnosed with cancer, and the importance of support during treatment. CSW informed patient of the support team and support services at [facility]. CSW provided contact information and encouraged patient to call with any questions or concerns.  Per wife, patient is in no distress "he has already been treated for cancer last year", "he's doing OK", lives at home w wife, has support from daughter who assists w transportation as needed for appointments.  Requested mailed packet of information about [place] - also encouraged family to contact CSW at the [place] if needed.  ONCBCN DISTRESS SCREENING 01/27/2018  Screening Type Initial Screening  Distress experienced in past week (1-10) 8  Emotional problem type Depression;Nervousness/Anxiety;Adjusting to illness;Isolation/feeling alone;Boredom;Feeling hopeless  Information Concerns Type Lack of info about diagnosis;Lack of info about treatment;Lack of info about complementary therapy choices  Physical Problem type Pain;Sleep/insomnia;Getting around;Mouth sores/swallowing;Loss of appetitie;Talking;Tingling hands/feet;Skin dry/itchy  Other (No Data)    Clinical Social Worker follow up needed: No.  If yes, follow up plan:  Edwyna Shell, LCSW Clinical Social Worker Phone:  [phone]

## 2018-01-28 NOTE — Progress Notes (Signed)
Please arrange for rad -onc consult in [facility] ASAP.

## 2018-01-28 NOTE — Patient Instructions (Signed)
[facility] at [facility]  Discharge Instructions:  You were seen by Dr. Peru today _______________________________________________________________  Thank you for choosing [facility] at [facility] to provide your oncology and hematology care.  To afford each patient quality time with our providers, please arrive at least 15 minutes before your scheduled appointment.  You need to re-schedule your appointment if you arrive 10 or more minutes late.  We strive to give you quality time with our providers, and arriving late affects you and other patients whose appointments are after yours.  Also, if you no show three or more times for appointments you may be dismissed from the clinic.  Again, thank you for choosing [facility] at [facility]. Our hope is that these requests will allow you access to exceptional care and in a timely manner. _______________________________________________________________  If you have questions after your visit, please contact our office at [phone] between the hours of 8:30 a.m. and 5:00 p.m. Voicemails left after 4:30 p.m. will not be returned until the following business day. _______________________________________________________________  For prescription refill requests, have your pharmacy contact our office. _______________________________________________________________  Recommendations made by the consultant and any test results will be sent to your referring physician. _______________________________________________________________ 

## 2018-01-28 NOTE — Progress Notes (Signed)
HEM/ONC FOLLOW UP NOTE ([place])  CHIEF COMPLAINT: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     HISTORY OF PRESENT ILLNESS:  this is a pleasant 77 year old gentleman who was first diagnosed with vocal right vocal CORD invasive squamous cell cancer in October 2016 tumor was negative for PE 16 expression.  He received radiotherapy 65 Gy in 29 fractions under the direction of Dr. Isidore Swanson between November 2016 through January 2017.  He had been noncompliant with his surveillance visits  He has been in remission until August 2018, when he presented with dysphagia and right ear pain.   CT with contrast of the soft tissue of neck from November 21, 2017 showed a right neck nodal mass that is inseparable from both the right carotid space in the anterior and medial right sternocleidomastoid muscle at the right internal jugular vein appear to be invaded and thrombosed the right carotid bifurcation is involved but remains patent.  This abnormality extends to the margin of the right level 3 nodal station caudal to the dominant mass right level 3 and level 4 nodes appear within normal limits. No contralateral left left neck adenopathy was noted. He was noted to have a 4.1 cm lobulated right neck soft tissue mass consistent with lymph node conglomeration with extracapsular extension is extended from level 2 to level 3 with involvement of the right carotid space and right sternocleidomastoid muscle.  The tumor was invading and occluding the right internal jugular vein, the right carotid remained patent.  On January 05, 2018, an open biopsy was performed that revealed keratinizing squamous cell carcinoma.  Exam under anesthesia was then performed on 01/21/2008 that showed no additional invasive lesions within the pharynx larynx trachea or bronchi. Biopsies from the right  hypopharyngeal and pyriform sinus showed low-grade squamous dysplasia with no carcinoma.  A PET scan from January 23, 2018 showed the lesion to be 2.8 x 4.6 cm in the right level 2 and level 3 lymph nodes in the neck and an additional 8 mm right level 3 nodal metastases.  There was no distant metastatic spread.  No bone lesions were noted.  INTERVAL HISTORY: Patient continues to have increasing pain with swallowing his ability to eat solids is diminishing by the day he is currently only able to eat some pudding and Jell-O with some liquids. He states his fullness in the ear and pain with moving the neck on the right side is also progressively getting worse, he is lost about 12 pounds in the past 2 months.  His past medical history is significant for chronic active heavy smoking depression anxiety hypertension and GERD.  He has a history of ischemic heart disease chronic back pain, COPD and hypertension He had a negative dobutamine stress study in November 2017.  He denies any exertional chest pain he is limited by his COPD with walking up to 2-3 blocks slowly limited by shortness of breath. He is not a known alcoholic.  Medications have been reviewed he is currently on hydrocodone which seems to be partially effective for pain.  He also takes Mobic.  His most recent CBC and CMP from [facility] dated 01/20/2018 were unremarkable his albumin was 4.6 alkaline phosphatase was 30 creatinine was 0.82.  Exam: Vitals are stable today as noted in the EMR. He appears well-nourished no pallor or icterus was noted.  Oral cavity is normal right  neck shows contracture of the sternocleidomastoid with tenderness on palpation recent surgical incision is healing well.  Underneath that area there there is some induration but I could not fully palpate due to tenderness.  Left neck is without any lymphadenopathy.  No axillary lymph nodes were felt. Lungs air entry is equal on both sides prolonged expiration no  rhonchi.  Abdomen soft nontender no hepatosplenomegaly.  All 4 extremities are without edema. CNS alert awake oriented x3 no gross motor or sensory deficits. Psychiatric history: He is in normal mood memory is norma.   ASSESSMENT AND PLAN:  Squamous cell cancer of the larynx.  Presenting with recurrent right neck adenopathy. Patient has been seen by radiation oncology at [facility] and is waiting to be scheduled to see head and neck surgery. He is here to discuss his treatment options.  I reviewed his CT images and I reviewed Dr. Lanell Swanson consultation notes. I agree that this is a complicated scenario.  The tumor is encroaching the right carotid space and is attached to the right carotid bifurcation and right internal jugular vein. Patient has no distant metastatic spread on the PET scan.  He is experiencing severe pain in that area and is unable to swallow any solids.  He has been basically getting along with some fluids and pudding.  He has lost about 12 pounds in the past 10 days. It appears that the best way to go about would be surgical resection if possible.   However if the surgical option is felt to be risky, his next best option would be radiation either sequential or concurrent. However based on Dr. Lanell Swanson notes it appears that some portion of the recurrence is in the area where he was previously irradiated, I am not sure if reradiation of this area is an option.  If systemic therapy alone is recommended in the end I would consider a regimen including cetuximab with docetaxel and platinum.   Progressive dysphagia - at risk for dehydration and malnutrition.I recommended that if surgery cannot be done soon that he should seriously consider having a PEG feeding tube placed.  I asked him to contact us if this is the case  We discussed the potential side effects from chemotherapy including nausea, vomiting, cytopenias, risk for life-threatening infection such as pneumonia. Risk of  allergic reaction, kidney damage, fatigue, anorexia, diarrhea, dehydration have been explained as well. I have also stressed on the fact that successful outcome from treatment dependence very much on his compliance.   Patient was accompanied by his daughter today.  I have also discussed with him the option of palliative care alone. He is undecided, wants to hear from surgeon before making his decision.

## 2018-01-29 ENCOUNTER — Encounter: Payer: Self-pay | Admitting: *Deleted

## 2018-01-29 ENCOUNTER — Telehealth: Payer: Self-pay | Admitting: *Deleted

## 2018-01-29 NOTE — Telephone Encounter (Signed)
Oncology Nurse Navigator Documentation  Per Dr. Pearlie Oyster guidance and call from [place], [facility] [place], faxed he following information for pt's pending appt (ntification of successful fax transmission received):  1) Dr. Pearlie Oyster 01/27/18 Re-consult Note,  2) Dr. Corliss Skains 01/28/18 Progress Note, not finalized yet, (Medical Oncology, [facility]),  3) ENT Dr. Deeann Saint notes, 01/05/18, 01/20/18,  4) 01/05/18 and 01/20/18 pathology reports, and  5) Reports for 11/21/17 CT Neck and 01/05/18 PET.   I requested of [facility] transfer of above imaging to disc and mailing to [facility].  Gayleen Orem, RN, BSN Head & Neck Oncology Nurse [place] at [place] [phone]

## 2018-01-29 NOTE — Progress Notes (Signed)
Oncology Nurse Navigator Documentation  Met with Mr. Riviera and his dtr during re-consult with Dr. Isidore Moos to discuss tmt for R neck nodal disease s/p tmt for R vocal cord carcinoma in fall of 2017. They voiced understanding of surgical vs further RT, risks/benefits of both. He agreed to referral to [facility] to evaluate surgery.   He requested referral to [facility] should RT be offered. He acknowledged he continues to smoke.  I provided information/discussed opportunities for smoking cessation support:    QuitSmart class/individual counseling at [facility], encouraged investigation of similar programs at [facility].  Quitline [employer] educational information I provided my contact information, encourage them to call me with questions during transition of care.  Gayleen Orem, RN, BSN Head & Neck Oncology Nurse [place] at [place] [phone]

## 2018-01-30 ENCOUNTER — Telehealth: Payer: Self-pay | Admitting: *Deleted

## 2018-01-30 NOTE — Telephone Encounter (Signed)
Oncology Nurse Navigator Documentation  Called pt's dtr to encourage next week Wed appt with Dr. Conley Canal rather then previously arranged 2/13.  She agreed, I provided her phone number to call.  I noted I would call [place] at [facility] to arrange appt with RadOnc physician s/p WF appt should discussion re further RT be needed.  She voiced understanding.  I later spoke with Carlean Purl CCR who agreed to call her to schedule appt.  Gayleen Orem, RN, BSN Head & Neck Oncology Nurse [place] at [place] [phone]

## 2018-02-05 ENCOUNTER — Ambulatory Visit (HOSPITAL_COMMUNITY): Payer: Medicare HMO | Admitting: Internal Medicine

## 2018-02-09 ENCOUNTER — Ambulatory Visit (HOSPITAL_COMMUNITY): Payer: Medicare HMO | Admitting: Internal Medicine

## 2018-02-13 ENCOUNTER — Ambulatory Visit (HOSPITAL_COMMUNITY): Payer: Medicare HMO | Admitting: Oncology

## 2018-04-08 ENCOUNTER — Observation Stay (HOSPITAL_COMMUNITY)
Admission: EM | Admit: 2018-04-08 | Discharge: 2018-04-09 | Disposition: A | Discharge status: Home / Self Care | Payer: Medicare HMO | Attending: Internal Medicine | Admitting: Internal Medicine

## 2018-04-08 ENCOUNTER — Observation Stay (HOSPITAL_BASED_OUTPATIENT_CLINIC_OR_DEPARTMENT_OTHER): Payer: Medicare HMO

## 2018-04-08 ENCOUNTER — Other Ambulatory Visit: Payer: Self-pay

## 2018-04-08 ENCOUNTER — Emergency Department (HOSPITAL_COMMUNITY): Payer: Medicare HMO

## 2018-04-08 ENCOUNTER — Encounter (HOSPITAL_COMMUNITY): Payer: Self-pay | Admitting: Emergency Medicine

## 2018-04-08 DIAGNOSIS — K219 Gastro-esophageal reflux disease without esophagitis: Secondary | ICD-10-CM

## 2018-04-08 DIAGNOSIS — R1310 Dysphagia, unspecified: Secondary | ICD-10-CM | POA: Insufficient documentation

## 2018-04-08 DIAGNOSIS — J449 Chronic obstructive pulmonary disease, unspecified: Secondary | ICD-10-CM | POA: Diagnosis not present

## 2018-04-08 DIAGNOSIS — Z720 Tobacco use: Secondary | ICD-10-CM | POA: Diagnosis present

## 2018-04-08 DIAGNOSIS — Z7982 Long term (current) use of aspirin: Secondary | ICD-10-CM | POA: Insufficient documentation

## 2018-04-08 DIAGNOSIS — Z931 Gastrostomy status: Secondary | ICD-10-CM | POA: Diagnosis not present

## 2018-04-08 DIAGNOSIS — I10 Essential (primary) hypertension: Secondary | ICD-10-CM

## 2018-04-08 DIAGNOSIS — C329 Malignant neoplasm of larynx, unspecified: Secondary | ICD-10-CM | POA: Insufficient documentation

## 2018-04-08 DIAGNOSIS — R079 Chest pain, unspecified: Secondary | ICD-10-CM | POA: Diagnosis not present

## 2018-04-08 DIAGNOSIS — I5030 Unspecified diastolic (congestive) heart failure: Secondary | ICD-10-CM

## 2018-04-08 DIAGNOSIS — Z87891 Personal history of nicotine dependence: Secondary | ICD-10-CM | POA: Diagnosis not present

## 2018-04-08 DIAGNOSIS — Z79899 Other long term (current) drug therapy: Secondary | ICD-10-CM | POA: Insufficient documentation

## 2018-04-08 DIAGNOSIS — R0602 Shortness of breath: Secondary | ICD-10-CM | POA: Diagnosis present

## 2018-04-08 DIAGNOSIS — C320 Malignant neoplasm of glottis: Secondary | ICD-10-CM | POA: Diagnosis present

## 2018-04-08 DIAGNOSIS — Z95828 Presence of other vascular implants and grafts: Secondary | ICD-10-CM | POA: Insufficient documentation

## 2018-04-08 HISTORY — DX: Malignant neoplasm of larynx, unspecified: C32.9

## 2018-04-08 HISTORY — DX: Secondary and unspecified malignant neoplasm of lymph nodes of head, face and neck: C77.0

## 2018-04-08 LAB — I-STAT TROPONIN, ED: Troponin i, poc: 0 ng/mL (ref 0.00–0.08)

## 2018-04-08 LAB — CBC WITH DIFFERENTIAL/PLATELET
Basophils Absolute: 0 10*3/uL (ref 0.0–0.1)
Basophils Relative: 0 %
EOS ABS: 0 10*3/uL (ref 0.0–0.7)
Eosinophils Relative: 0 %
HCT: 46.2 % (ref 39.0–52.0)
HEMOGLOBIN: 14.9 g/dL (ref 13.0–17.0)
LYMPHS ABS: 1.2 10*3/uL (ref 0.7–4.0)
LYMPHS PCT: 13 %
MCH: 29.5 pg (ref 26.0–34.0)
MCHC: 32.3 g/dL (ref 30.0–36.0)
MCV: 91.5 fL (ref 78.0–100.0)
Monocytes Absolute: 0 10*3/uL — ABNORMAL LOW (ref 0.1–1.0)
Monocytes Relative: 0 %
NEUTROS PCT: 87 %
Neutro Abs: 7.9 10*3/uL — ABNORMAL HIGH (ref 1.7–7.7)
Platelets: 153 10*3/uL (ref 150–400)
RBC: 5.05 MIL/uL (ref 4.22–5.81)
RDW: 14.6 % (ref 11.5–15.5)
WBC: 9.1 10*3/uL (ref 4.0–10.5)

## 2018-04-08 LAB — COMPREHENSIVE METABOLIC PANEL
ALK PHOS: 23 U/L — AB (ref 38–126)
ALT: 154 U/L — AB (ref 17–63)
AST: 35 U/L (ref 15–41)
Albumin: 3.3 g/dL — ABNORMAL LOW (ref 3.5–5.0)
Anion gap: 12 (ref 5–15)
BUN: 23 mg/dL — AB (ref 6–20)
CALCIUM: 9 mg/dL (ref 8.9–10.3)
CO2: 28 mmol/L (ref 22–32)
CREATININE: 0.68 mg/dL (ref 0.61–1.24)
Chloride: 97 mmol/L — ABNORMAL LOW (ref 101–111)
GFR calc Af Amer: >60 mL/min (ref >60)
GFR calc non Af Amer: >60 mL/min (ref >60)
GLUCOSE: 155 mg/dL — AB (ref 65–99)
Potassium: 3.6 mmol/L (ref 3.5–5.1)
SODIUM: 137 mmol/L (ref 135–145)
Total Bilirubin: 1.2 mg/dL (ref 0.3–1.2)
Total Protein: 6.1 g/dL — ABNORMAL LOW (ref 6.5–8.1)

## 2018-04-08 LAB — URINALYSIS, ROUTINE W REFLEX MICROSCOPIC
Bilirubin Urine: NEGATIVE
GLUCOSE, UA: NEGATIVE mg/dL
HGB URINE DIPSTICK: NEGATIVE
KETONES UR: NEGATIVE mg/dL
LEUKOCYTES UA: NEGATIVE
Nitrite: NEGATIVE
PROTEIN: NEGATIVE mg/dL
Specific Gravity, Urine: 1.014 (ref 1.005–1.030)
pH: 7 (ref 5.0–8.0)

## 2018-04-08 LAB — ECHOCARDIOGRAM COMPLETE
HEIGHTINCHES: 70 in
WEIGHTICAEL: 2544 [oz_av]

## 2018-04-08 LAB — CREATININE, SERUM
CREATININE: 0.66 mg/dL (ref 0.61–1.24)
GFR calc Af Amer: >60 mL/min (ref >60)

## 2018-04-08 LAB — TROPONIN I
Troponin I: <0.03 ng/mL (ref <0.03)
Troponin I: <0.03 ng/mL (ref <0.03)
Troponin I: <0.03 ng/mL (ref <0.03)

## 2018-04-08 LAB — BRAIN NATRIURETIC PEPTIDE: B NATRIURETIC PEPTIDE 5: 103 pg/mL — AB (ref 0.0–100.0)

## 2018-04-08 LAB — CBC
HCT: 43.2 % (ref 39.0–52.0)
Hemoglobin: 13.8 g/dL (ref 13.0–17.0)
MCH: 29.4 pg (ref 26.0–34.0)
MCHC: 31.9 g/dL (ref 30.0–36.0)
MCV: 91.9 fL (ref 78.0–100.0)
PLATELETS: 161 10*3/uL (ref 150–400)
RBC: 4.7 MIL/uL (ref 4.22–5.81)
RDW: 14.6 % (ref 11.5–15.5)
WBC: 9.1 10*3/uL (ref 4.0–10.5)

## 2018-04-08 MED ORDER — ONDANSETRON HCL 4 MG/2ML IJ SOLN: 4.0000 mg | Freq: Four times a day (QID) | INTRAMUSCULAR | Status: DC | PRN

## 2018-04-08 MED ORDER — ATORVASTATIN CALCIUM 40 MG PO TABS
40.0000 mg | ORAL_TABLET | Freq: Every day | ORAL | Status: DC
  Administered 2018-04-08 – 2018-04-09 (×2): 40 mg
  Filled 2018-04-08 (×2): qty 1

## 2018-04-08 MED ORDER — SODIUM CHLORIDE 0.9% FLUSH: 3.0000 mL | INTRAVENOUS | Status: DC | PRN

## 2018-04-08 MED ORDER — SODIUM CHLORIDE 0.9 % IV SOLN: 250.0000 mL | INTRAVENOUS | Status: DC | PRN

## 2018-04-08 MED ORDER — NITROGLYCERIN 0.4 MG SL SUBL: 0.4000 mg | SUBLINGUAL_TABLET | SUBLINGUAL | Status: DC | PRN

## 2018-04-08 MED ORDER — HYDROCODONE-ACETAMINOPHEN 5-325 MG PO TABS
1.0000 | ORAL_TABLET | ORAL | Status: DC | PRN
  Administered 2018-04-08 – 2018-04-09 (×2): 1
  Filled 2018-04-08 (×2): qty 1

## 2018-04-08 MED ORDER — NITROGLYCERIN 0.4 MG SL SUBL
0.4000 mg | SUBLINGUAL_TABLET | SUBLINGUAL | Status: DC | PRN
  Administered 2018-04-08: 0.4 mg via SUBLINGUAL
  Filled 2018-04-08: qty 1

## 2018-04-08 MED ORDER — SODIUM CHLORIDE 0.9 % IV BOLUS
250.0000 mL | Freq: Once | INTRAVENOUS | Status: AC
  Administered 2018-04-08: 250 mL via INTRAVENOUS

## 2018-04-08 MED ORDER — JEVITY 1.2 CAL PO LIQD
480.0000 mL | Freq: Three times a day (TID) | ORAL | Status: DC
  Filled 2018-04-08 (×3): qty 711

## 2018-04-08 MED ORDER — NICOTINE 14 MG/24HR TD PT24
14.0000 mg | MEDICATED_PATCH | Freq: Every day | TRANSDERMAL | Status: DC
  Filled 2018-04-08 (×2): qty 1

## 2018-04-08 MED ORDER — PANTOPRAZOLE SODIUM 40 MG IV SOLR
40.0000 mg | Freq: Every day | INTRAVENOUS | Status: DC
  Administered 2018-04-08 – 2018-04-09 (×2): 40 mg via INTRAVENOUS
  Filled 2018-04-08 (×2): qty 40

## 2018-04-08 MED ORDER — ACETAMINOPHEN 650 MG RE SUPP: 650.0000 mg | Freq: Four times a day (QID) | RECTAL | Status: DC | PRN

## 2018-04-08 MED ORDER — HYDROCODONE-ACETAMINOPHEN 5-325 MG PO TABS
1.0000 | ORAL_TABLET | Freq: Four times a day (QID) | ORAL | Status: DC | PRN
  Administered 2018-04-08 (×2): 1
  Filled 2018-04-08 (×2): qty 1

## 2018-04-08 MED ORDER — IPRATROPIUM-ALBUTEROL 0.5-2.5 (3) MG/3ML IN SOLN
3.0000 mL | Freq: Once | RESPIRATORY_TRACT | Status: AC
  Administered 2018-04-08: 3 mL via RESPIRATORY_TRACT
  Filled 2018-04-08: qty 3

## 2018-04-08 MED ORDER — JEVITY 1.2 CAL PO LIQD
480.0000 mL | Freq: Four times a day (QID) | ORAL | Status: DC
  Administered 2018-04-08 – 2018-04-09 (×2): 480 mL
  Filled 2018-04-08 (×3): qty 1000
  Filled 2018-04-08: qty 711
  Filled 2018-04-08: qty 1000
  Filled 2018-04-08 (×2): qty 711

## 2018-04-08 MED ORDER — ZOLPIDEM TARTRATE 5 MG PO TABS
5.0000 mg | ORAL_TABLET | Freq: Every evening | ORAL | Status: AC | PRN
  Administered 2018-04-08: 5 mg via ORAL
  Filled 2018-04-08: qty 1

## 2018-04-08 MED ORDER — ENOXAPARIN SODIUM 40 MG/0.4ML ~~LOC~~ SOLN
40.0000 mg | Freq: Every day | SUBCUTANEOUS | Status: DC
  Administered 2018-04-08 – 2018-04-09 (×2): 40 mg via SUBCUTANEOUS
  Filled 2018-04-08 (×2): qty 0.4

## 2018-04-08 MED ORDER — SODIUM CHLORIDE 0.9% FLUSH
3.0000 mL | Freq: Two times a day (BID) | INTRAVENOUS | Status: DC
  Administered 2018-04-08 – 2018-04-09 (×2): 3 mL via INTRAVENOUS

## 2018-04-08 MED ORDER — ASPIRIN 81 MG PO CHEW
324.0000 mg | CHEWABLE_TABLET | Freq: Once | ORAL | Status: AC
  Administered 2018-04-08: 324 mg via ORAL
  Filled 2018-04-08: qty 4

## 2018-04-08 MED ORDER — ONDANSETRON HCL 4 MG PO TABS: 4.0000 mg | ORAL_TABLET | Freq: Four times a day (QID) | ORAL | Status: DC | PRN

## 2018-04-08 MED ORDER — MAGIC MOUTHWASH
5.0000 mL | Freq: Four times a day (QID) | ORAL | Status: DC
  Administered 2018-04-08 – 2018-04-09 (×3): 5 mL via ORAL
  Filled 2018-04-08 (×3): qty 5

## 2018-04-08 MED ORDER — SENNOSIDES-DOCUSATE SODIUM 8.6-50 MG PO TABS
1.0000 | ORAL_TABLET | Freq: Every evening | ORAL | Status: DC | PRN
  Administered 2018-04-08: 1 via ORAL
  Filled 2018-04-08: qty 1

## 2018-04-08 MED ORDER — ACETAMINOPHEN 325 MG PO TABS: 650.0000 mg | ORAL_TABLET | Freq: Four times a day (QID) | ORAL | Status: DC | PRN

## 2018-04-08 NOTE — ED Notes (Signed)
Pt requested for excess secretions to be suctioned. Suctioned with Elita Boone without difficulty

## 2018-04-08 NOTE — ED Notes (Signed)
ECHO completed

## 2018-04-08 NOTE — ED Triage Notes (Addendum)
PT c/o recurrent SOB starting last night. PT's daughter reports 2 recent hospitalizations at [facility] for dx of COPD exacerbations. PT currently taking radiation/chemo treatments for throat cancer and has a peg tube for nutrition. PT c/o left sided sharp chest pain occasionally as well.

## 2018-04-08 NOTE — ED Notes (Signed)
[facility] called at this time to page cardiology to look at EKG changes from previous EKG.

## 2018-04-08 NOTE — H&P (Signed)
History and Physical    ISAM UNREIN NKN:[id number] DOB: [birth year] DOA: 04/08/2018  PCP: Dione Housekeeper, MD   Patient coming from: Home  Chief Complaint: Chest pain and dysphagia  HPI: James Swanson is a 77 y.o. 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  He was recently discharged from [facility] 4 days ago after being treated for a COPD exacerbation.  Patient denies any exertional chest pain or radiation of his neck or to his arm.  He states that his pain has now improved after administration of aspirin and nitroglycerin in the ED.  Patient and daughter at bedside state that he uses feeding supplementation through PEG tube, but is also usually able to eat and has not been able to do so over the last several days.  They are quite concerned about his worsening ability to swallow as well. Denies cough, sputum production, dyspnea on exertion, lower extremity edema, or orthopnea. He denies any fever or chills.   ED Course: Vital signs are stable and patient has been placed on 1 L nasal cannula.  Laboratory data is unremarkable with negative troponin.  EKG demonstrates a chronic left bundle branch block which was reviewed by ED physician as well as cardiology on call with no findings of STEMI.  Review of Systems: All others reviewed and otherwise negative.  Past Medical History:  Diagnosis Date  . Anxiety   . Arthritis    back  . Chronic back pain   . Depression   . GERD (gastroesophageal reflux disease)   . Heavy cigarette smoker    2ppd  . Hypertension   . Laryngeal cancer ([place])   . 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 Squamous cell carcinoma of vocal cord ([place]) 10/30/2015  . Vocal cord mass     Past Surgical History:  Procedure Laterality Date  . ESOPHAGOSCOPY  N/A 01/20/2018   Procedure: ESOPHAGOSCOPY;  Surgeon: Leta Baptist, MD;  Location: [place];  Service: ENT;  Laterality: N/A;  . HERNIA REPAIR Right   . LARYNGOSCOPY AND BRONCHOSCOPY N/A 01/20/2018   Procedure: DIRECT LARYNGOSCOPY WITH BIOPSY AND BRONCHOSCOPY;  Surgeon: Leta Baptist, MD;  Location: [place];  Service: ENT;  Laterality: N/A;  . MASS BIOPSY Right 01/05/2018   Procedure: RIGHT NECK MASS BIOPSY;  Surgeon: Leta Baptist, MD;  Location: [place];  Service: ENT;  Laterality: Right;  . MICROLARYNGOSCOPY Right 10/24/2015   Procedure: MICRO DIRECT LARYNGOSCOPY WITH VOCAL CORD BIOPSY ;  Surgeon: Leta Baptist, MD;  Location: [place];  Service: ENT;  Laterality: Right;  . PEG TUBE PLACEMENT    . PORTA CATH INSERTION    . TYMPANOPLASTY Right      reports that he quit smoking 9 days ago. He has a 120.00 pack-year smoking history. He has never used smokeless tobacco. He reports that he does not drink alcohol or use drugs.  Allergies  Allergen Reactions  . Asa [Aspirin]     GI upset    Family History  Family history unknown: Yes    Prior to Admission medications   Medication Sig Start Date End Date Taking? Authorizing Provider  FIBER PO Take 1 Can by mouth 6 (six) times daily. 04/02/18  Yes [provider]  acetaminophen (TYLENOL) 500 MG tablet Take 1,000  mg by mouth every 6 (six) hours as needed.    [provider]  amLODipine (NORVASC) 5 MG tablet TAKE ONE TABLET BY MOUTH ONCE DAILY 08/26/16   [provider]  aspirin 81 MG chewable tablet Chew by mouth. 10/01/16   [provider]  atorvastatin (LIPITOR) 40 MG tablet Take 40 mg by mouth daily.    [provider]  HYDROcodone-acetaminophen (NORCO/VICODIN) 5-325 MG tablet Take 1 tablet by mouth every 6 (six) hours as needed for moderate pain.    [provider]  nitroGLYCERIN (NITROSTAT) 0.4 MG SL tablet DISSOLVE ONE TABLET UNDER THE  TONGUE EVERY 5 MINUTES AS NEEDED FOR CHEST PAIN.  DO NOT EXCEED A TOTAL OF 3 DOSES IN 15 MINUTES 02/01/16   [provider]  pantoprazole (PROTONIX) 40 MG tablet Take 40 mg by mouth daily.    [provider]    Physical Exam: Vitals:   04/08/18 0830 04/08/18 0900 04/08/18 0915 04/08/18 0930  BP: 98/65 (!) 82/61  95/67  Pulse:   94 96  Resp: (!) 27     Temp:      TempSrc:      SpO2:  92% 90% 92%  Weight:      Height:        Constitutional: NAD, calm, comfortable Vitals:   04/08/18 0830 04/08/18 0900 04/08/18 0915 04/08/18 0930  BP: 98/65 (!) 82/61  95/67  Pulse:   94 96  Resp: (!) 27     Temp:      TempSrc:      SpO2:  92% 90% 92%  Weight:      Height:       Eyes: lids and conjunctivae normal ENMT: Mucous membranes are moist.  Neck: normal, supple Respiratory: clear to auscultation bilaterally. Normal respiratory effort. No accessory muscle use. On 1L [place]. Cardiovascular: Regular rate and rhythm, no murmurs. No extremity edema. Abdomen: no tenderness, no distention. Bowel sounds positive.  Musculoskeletal:  No joint deformity upper and lower extremities.   Skin: no rashes, lesions, ulcers.  Psychiatric: Normal judgment and insight. Alert and oriented x 3. Normal mood.   Labs on Admission: I have personally reviewed following labs and imaging studies  CBC: Recent Labs  Lab 04/08/18 0723  WBC 9.1  NEUTROABS 7.9*  HGB 14.9  HCT 46.2  MCV 91.5  PLT 010   Basic Metabolic Panel: Recent Labs  Lab 04/08/18 0723  NA 137  K 3.6  CL 97*  CO2 28  GLUCOSE 155*  BUN 23*  CREATININE 0.68  CALCIUM 9.0   GFR: Estimated Creatinine Clearance: 81.4 mL/min (by C-G formula based on SCr of 0.68 mg/dL). Liver Function Tests: Recent Labs  Lab 04/08/18 0723  AST 35  ALT 154*  ALKPHOS 23*  BILITOT 1.2  PROT 6.1*  ALBUMIN 3.3*   No results for input(s): LIPASE, AMYLASE in the last 168 hours. No results for input(s): AMMONIA in the last 168  hours. Coagulation Profile: No results for input(s): INR, PROTIME in the last 168 hours. Cardiac Enzymes: No results for input(s): CKTOTAL, CKMB, CKMBINDEX, TROPONINI in the last 168 hours. BNP (last 3 results) No results for input(s): PROBNP in the last 8760 hours. HbA1C: No results for input(s): HGBA1C in the last 72 hours. CBG: No results for input(s): GLUCAP in the last 168 hours. Lipid Profile: No results for input(s): CHOL, HDL, LDLCALC, TRIG, CHOLHDL, LDLDIRECT in the last 72 hours. Thyroid Function Tests: No results for input(s): TSH, T4TOTAL, FREET4, T3FREE, THYROIDAB  in the last 72 hours. Anemia Panel: No results for input(s): VITAMINB12, FOLATE, FERRITIN, TIBC, IRON, RETICCTPCT in the last 72 hours. Urine analysis: No results found for: COLORURINE, APPEARANCEUR, LABSPEC, PHURINE, GLUCOSEU, HGBUR, BILIRUBINUR, KETONESUR, PROTEINUR, UROBILINOGEN, NITRITE, LEUKOCYTESUR  Radiological Exams on Admission: Dg Chest 2 View  Result Date: 04/08/2018 CLINICAL DATA:  Onset of shortness of breath and occasional sharp chest pain last night. Currently undergoing chemo radiation for throat malignancy. History of COPD. Former smoker. EXAM: CHEST - 2 VIEW COMPARISON:  Chest x-ray of April 03, 2018 FINDINGS: The lungs are adequately inflated. The interstitial markings are mildly prominent though stable. There is no alveolar infiltrate, pleural effusion, or pneumothorax. The heart and pulmonary vascularity are normal. There is calcification in the wall of the aortic arch. The porta catheter tip projects over the midportion of the SVC. There is no pleural effusion. There is mild multilevel degenerative disc disease of the thoracic spine. IMPRESSION: Chronic bronchitic-smoking related changes. No pneumonia nor other acute cardiopulmonary abnormality. Thoracic aortic atherosclerosis. Electronically Signed   By: David  Martinique M.D.   On: 04/08/2018 08:14    EKG: Independently reviewed.  Sinus tachycardia  at 108 bpm.  LAFB with LVH.  Chronic left bundle branch block reviewed by cardiology.  Assessment/Plan Principal Problem:   Chest pain Active Problems:   Squamous cell carcinoma of right vocal cord (HCC)   Tobacco abuse   Essential hypertension   GERD (gastroesophageal reflux disease)    1. Chest pain.  Monitor on telemetry with nitroglycerin as needed.  Cycle troponins x3 sets every 6 hours.  Obtain 2D echocardiogram. 2. Worsening dysphagia secondary to laryngeal carcinoma.  SLP evaluation as well as nutrition evaluation to ensure that patient is meeting nutritional requirements. 3. Tobacco abuse.  Nicotine patch.  States that he actually quit a few days back. 4. Hypertension.  Currently soft BPs.  Hold home medications. 5. GERD.  Continue on Protonix.   DVT prophylaxis: Lovenox Code Status: Full Family Communication: Daughter at bedside Disposition Plan: Chest pain rule out and further evaluation of her worsening dysphasia Consults called: SLP, nutrition Admission status: Observation, telemetry   Seng Larch D [facility] Pager [phone]  If 7PM-7AM, please contact night-coverage [email or web address] Password [email or web address]  04/08/2018, 10:03 AM

## 2018-04-08 NOTE — ED Notes (Addendum)
Daughter reports that pt has been in [place] for the past 2 weekends for COPD exacerbation. Placed on prednisone He is currently NPO due to larynx cancer. He was dx last oct 2018. Reports he was treated 2 years ago for this cancer. Pt goes to radiation twice daily M-F and Chemo every Wednesday. Pt receives 6 cans of Jevity daily.

## 2018-04-08 NOTE — ED Notes (Signed)
Care handoff delayed due to echo in progress

## 2018-04-08 NOTE — ED Notes (Signed)
Pt unable to give urine specimen at this time 

## 2018-04-08 NOTE — ED Provider Notes (Signed)
[facility] EMERGENCY DEPARTMENT Provider Note   CSN: [id number] Arrival date & time: 04/08/18  5102     History   Chief Complaint Chief Complaint  Patient presents with  . Shortness of Breath    HPI James Swanson is a 77 y.o. male.  HPI  Pt was seen at 0710.  Per pt and his family, c/o gradual onset and worsening of persistent SOB for the past several days. Pt was d/c from [facility] 4 days ago for dx COPD exacerbation. Pt states he has not used his MDI at home. Pt states he began to have chest "pains" approximately 0300 PTA. Denies palpitations, no cough, no abd pain, no N/V/D, no fevers.   Past Medical History:  Diagnosis Date  . Anxiety   . Arthritis    back  . Chronic back pain   . Depression   . GERD (gastroesophageal reflux disease)   . Heavy cigarette smoker    2ppd  . Hypertension   . Laryngeal cancer ([place])   . 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 Vocal cord mass     Patient Active Problem List   Diagnosis Date Noted  . Tobacco abuse 12/25/2015  . 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 ESOPHAGOSCOPY N/A 01/20/2018   Procedure: ESOPHAGOSCOPY;  Surgeon: Leta Baptist, MD;  Location: [place];  Service: ENT;  Laterality: N/A;  . HERNIA REPAIR Right   . LARYNGOSCOPY AND BRONCHOSCOPY N/A 01/20/2018   Procedure: DIRECT LARYNGOSCOPY WITH BIOPSY AND BRONCHOSCOPY;  Surgeon: Leta Baptist, MD;  Location: [place];  Service: ENT;  Laterality: N/A;  . MASS BIOPSY Right 01/05/2018   Procedure: RIGHT NECK MASS BIOPSY;  Surgeon: Leta Baptist, MD;  Location: [place];  Service: ENT;  Laterality: Right;  . MICROLARYNGOSCOPY Right 10/24/2015   Procedure: MICRO DIRECT LARYNGOSCOPY WITH VOCAL CORD BIOPSY ;  Surgeon: Leta Baptist, MD;  Location: [place];  Service: ENT;   Laterality: Right;  . PEG TUBE PLACEMENT    . PORTA CATH INSERTION    . TYMPANOPLASTY Right         Home Medications    Prior to Admission medications   Medication Sig Start Date End Date Taking? Authorizing Provider  acetaminophen (TYLENOL) 500 MG tablet Take 1,000 mg by mouth every 6 (six) hours as needed.    [provider]  amLODipine (NORVASC) 5 MG tablet TAKE ONE TABLET BY MOUTH ONCE DAILY 08/26/16   [provider]  aspirin 81 MG chewable tablet Chew by mouth. 10/01/16   [provider]  atorvastatin (LIPITOR) 40 MG tablet Take 40 mg by mouth daily.    [provider]  HYDROcodone-acetaminophen (NORCO/VICODIN) 5-325 MG tablet Take 1 tablet by mouth every 6 (six) hours as needed for moderate pain.    [provider]  nitroGLYCERIN (NITROSTAT) 0.4 MG SL tablet DISSOLVE ONE TABLET UNDER THE TONGUE EVERY 5 MINUTES AS NEEDED FOR CHEST PAIN.  DO NOT EXCEED A TOTAL OF 3 DOSES IN 15 MINUTES 02/01/16   [provider]  pantoprazole (PROTONIX) 40 MG tablet Take 40 mg by mouth daily.    [provider]    Family History Family History  Family history unknown: Yes    Social History Social History   Tobacco Use  . Smoking  status: Former Smoker    Packs/day: 2.00    Years: 60.00    Pack years: 120.00    Last attempt to quit: 03/30/2018    Years since quitting: 0.0  . Smokeless tobacco: Never Used  Substance Use Topics  . 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 Drug use: No     Allergies   Asa [aspirin]   Review of Systems Review of Systems ROS: Statement: All systems negative except as marked or noted in the HPI; Constitutional: Negative for fever and chills. ; ; Eyes: Negative for eye pain, redness and discharge. ; ; ENMT: Negative for ear pain, hoarseness, nasal congestion, sinus pressure and sore throat. ; ; Cardiovascular: +CP. Negative for palpitations, diaphoresis, and peripheral  edema. ; ; Respiratory: +SOB. Negative for cough, wheezing and stridor. ; ; Gastrointestinal: Negative for nausea, vomiting, diarrhea, abdominal pain, blood in stool, hematemesis, jaundice and rectal bleeding. . ; ; Genitourinary: Negative for dysuria, flank pain and hematuria. ; ; Musculoskeletal: Negative for back pain and neck pain. Negative for swelling and trauma.; ; Skin: Negative for pruritus, rash, abrasions, blisters, bruising and skin lesion.; ; Neuro: Negative for headache, lightheadedness and neck stiffness. Negative for weakness, altered level of consciousness, altered mental status, extremity weakness, paresthesias, involuntary movement, seizure and syncope.       Physical Exam Updated Vital Signs BP 120/80   Pulse (!) 104   Temp 97.9 F (36.6 C) (Oral)   Resp (!) 22   Ht 5\' 10"  (1.778 m)   Wt 72.1 kg (159 lb)   SpO2 91%   BMI 22.81 kg/m   Physical Exam 0715: Physical examination:  Nursing notes reviewed; Vital signs and O2 SAT reviewed;  Constitutional: Well developed, Well nourished, Well hydrated, In no acute distress; Head:  Normocephalic, atraumatic; Eyes: EOMI, PERRL, No scleral icterus; ENMT: Mouth and pharynx normal, Mucous membranes moist. Voice hoarse per baseline.; Neck: Supple, Full range of motion, No lymphadenopathy; Cardiovascular: Tachycardic rate and rhythm, No gallop; Respiratory: Breath sounds coarse & equal bilaterally, No wheezes.  Speaking full sentences with ease, Normal respiratory effort/excursion; Chest: Nontender, Movement normal; Abdomen: +PEG. Soft, Nontender, Nondistended, Normal bowel sounds; Genitourinary: No CVA tenderness; Extremities: Peripheral pulses normal, No tenderness, No edema, No calf edema or asymmetry.; Neuro: AA&Ox3, +very HOH, otherwise major CN grossly intact.  Speech clear. No gross focal motor deficits in extremities.; Skin: Color normal, Warm, Dry.   ED Treatments / Results  Labs (all labs ordered are listed, but only abnormal  results are displayed)   EKG EKG Interpretation  Date/Time:  Wednesday April 08 2018 07:10:25 EDT Ventricular Rate:  108 PR Interval:    QRS Duration: 142 QT Interval:  360 QTC Calculation: 483 R Axis:   -80 Text Interpretation:  Sinus tachycardia RBBB and LAFB LVH by voltage When compared with ECG of 01/05/2018 Rate faster Confirmed by Francine Graven [phone]) on 04/08/2018 8:11:37 AM   Radiology   Procedures Procedures (including critical care time)  Medications Ordered in ED Medications  nitroGLYCERIN (NITROSTAT) SL tablet 0.4 mg (0.4 mg Sublingual Given 04/08/18 0728)  aspirin chewable tablet 324 mg (324 mg Oral Given 04/08/18 0728)     Initial Impression / Assessment and Plan / ED Course  I have reviewed the triage vital signs and the nursing notes.  Pertinent labs & imaging results that were available during my care of the patient were reviewed by me and considered in my medical decision  making (see chart for details).  MDM Reviewed: previous chart, vitals and nursing note Reviewed previous: labs and ECG Interpretation: labs, ECG and x-ray   Results for orders placed or performed during the hospital encounter of 04/08/18  CBC with Differential  Result Value Ref Range   WBC 9.1 4.0 - 10.5 K/uL   RBC 5.05 4.22 - 5.81 MIL/uL   Hemoglobin 14.9 13.0 - 17.0 g/dL   HCT 46.2 39.0 - 52.0 %   MCV 91.5 78.0 - 100.0 fL   MCH 29.5 26.0 - 34.0 pg   MCHC 32.3 30.0 - 36.0 g/dL   RDW 14.6 11.5 - 15.5 %   Platelets 153 150 - 400 K/uL   Neutrophils Relative % 87 %   Neutro Abs 7.9 (H) 1.7 - 7.7 K/uL   Lymphocytes Relative 13 %   Lymphs Abs 1.2 0.7 - 4.0 K/uL   Monocytes Relative 0 %   Monocytes Absolute 0.0 (L) 0.1 - 1.0 K/uL   Eosinophils Relative 0 %   Eosinophils Absolute 0.0 0.0 - 0.7 K/uL   Basophils Relative 0 %   Basophils Absolute 0.0 0.0 - 0.1 K/uL  Comprehensive metabolic panel  Result Value Ref Range   Sodium 137 135 - 145 mmol/L   Potassium 3.6 3.5 - 5.1  mmol/L   Chloride 97 (L) 101 - 111 mmol/L   CO2 28 22 - 32 mmol/L   Glucose, Bld 155 (H) 65 - 99 mg/dL   BUN 23 (H) 6 - 20 mg/dL   Creatinine, Ser 0.68 0.61 - 1.24 mg/dL   Calcium 9.0 8.9 - 10.3 mg/dL   Total Protein 6.1 (L) 6.5 - 8.1 g/dL   Albumin 3.3 (L) 3.5 - 5.0 g/dL   AST 35 15 - 41 U/L   ALT 154 (H) 17 - 63 U/L   Alkaline Phosphatase 23 (L) 38 - 126 U/L   Total Bilirubin 1.2 0.3 - 1.2 mg/dL   GFR calc non Af Amer >60 >60 mL/min   GFR calc Af Amer >60 >60 mL/min   Anion gap 12 5 - 15  Brain natriuretic peptide  Result Value Ref Range   B Natriuretic Peptide 103.0 (H) 0.0 - 100.0 pg/mL  I-stat troponin, ED  Result Value Ref Range   Troponin i, poc 0.00 0.00 - 0.08 ng/mL   Comment 3            Dg Chest 2 View Result Date: 04/08/2018 CLINICAL DATA:  Onset of shortness of breath and occasional sharp chest pain last night. Currently undergoing chemo radiation for throat malignancy. History of COPD. Former smoker. EXAM: CHEST - 2 VIEW COMPARISON:  Chest x-ray of April 03, 2018 FINDINGS: The lungs are adequately inflated. The interstitial markings are mildly prominent though stable. There is no alveolar infiltrate, pleural effusion, or pneumothorax. The heart and pulmonary vascularity are normal. There is calcification in the wall of the aortic arch. The porta catheter tip projects over the midportion of the SVC. There is no pleural effusion. There is mild multilevel degenerative disc disease of the thoracic spine. IMPRESSION: Chronic bronchitic-smoking related changes. No pneumonia nor other acute cardiopulmonary abnormality. Thoracic aortic atherosclerosis. Electronically Signed   By: David  Martinique M.D.   On: 04/08/2018 08:14    0720:  EKG with new ST elevations from previous, rate is also faster; has known RBBB.  T/C returned from [facility] Dr. Burt Knack, case discussed, including:  HPI, pertinent PM/SHx, VS/PE, dx testing, ED course and treatment:  He has viewed  the EKG, agrees EKG is  changed but not Code STEMI at this time.   7622:  Pt feels his symptoms are better after ASA and SL ntg. Requesting neb treatment. ED RN states pt's post pharynx suctioned free of thick mucus. Given pt is without wheezing, doubt COPD exacerbation at this time. Will give short neb to loosen thick secretions in throat. No cardiac testing in Epic chart review; will need to cycle troponins given new EKG changes today. Dx and testing d/w pt and family.  Questions answered.  Verb understanding, agreeable to admit.  T/C returned from [facility] Dr. Manuella Ghazi, case discussed, including:  HPI, pertinent PM/SHx, VS/PE, dx testing, ED course and treatment, as well as d/w Cards MD above:  Agreeable to admit.     Final Clinical Impressions(s) / ED Diagnoses   Final diagnoses:  None    ED Discharge Orders    None       Francine Graven, DO 04/11/18 1304

## 2018-04-08 NOTE — Progress Notes (Signed)
*  PRELIMINARY RESULTS* Echocardiogram 2D Echocardiogram has been performed.  James Swanson 04/08/2018, 1:00 PM

## 2018-04-08 NOTE — ED Notes (Signed)
O2 fluctuating 88-89 % 2 L O2 initiated via [place]

## 2018-04-08 NOTE — Progress Notes (Signed)
Initial Nutrition Assessment  DOCUMENTATION CODES:  Not applicable  INTERVENTION:  Restart Bolus tube feedings: Jevity 1.2 480 cc QID (~8 cans) at 0800, 1200, 1600, 2000. Flush 90 cc before and after each feed.  Provides: 2304 kcals, 107g Pro, 1549 ml fluid + 720 ml from flushes  If patient does pursue hospice/comfort care and quits tx, he could likely meet needs with his 6 cans of jevity and oral intake.   NUTRITION DIAGNOSIS:  Increased nutrient needs related to cancer and cancer related treatments as evidenced by estimated needs.  GOAL:  Patient will meet greater than or equal to 90% of their needs  MONITOR:  Diet advancement, Vent status, Weight trends, Skin, TF tolerance  REASON FOR ASSESSMENT:  Consult Assessment of nutrition requirement/status  ASSESSMENT:  77 y/o male PMHx tobacco abuse, Anxiety, depression, HTN, Squamous Cell carcinoma of vocal cord (tx in 2016-2017) which has recurred and now w/ laryngeal cancer (on chemoradiation). Presents w/ SOB and chest pain. Had recent admission at OSH for COPD exacerbation. Pt has PEG, but had also been eating, up until last several days. Admitted for further evaluation of chest pain/dysphagia  Pt followed by [facility] for [facility]. Notes are available in Care Everywhere. Per last oncology note 4/8, patient has unresectable disease due to tumor location he has been receiving chemo/radiation. Chemo begun Feb 02/25/2018 and XRT on 03/04/2018, Peg placed prior to chemo on 02/18/18.  Pt is alone on RD arrival. He is quite hoarse and at times difficult to understand. He reports being around 200 lbs when this all started. Per chart, he was 195 lbs on when he underwent biopsy on 01/05/2018. Bed wt wasn't working today. Most recent wt from [facility] was 159.2 lbs, indicating a loss of 36 lbs (18.5% of bw) in just over 3 months.   Pt says his home tube feeding regimen is only 6 cans of jevity 1.2, administered in 3, 2-can feeds. He is unsure of flush  amounts.   Per chart, it he only received orders to begin tube feedings on 3/19, for 5 cans, which was only just recently increased from 5 cans to 6 cans on 4/4. Up until 1 week ago, His TF regimen was only providing him with 1422 kcals, 66 cans of pro and 955 mls free water.   RD voices that he has not been receiving adequate nutrition via PEG and he will likely need to increase his tube feedings due to high requirement for tx. Patient voices "I dont think im gonna do it anymore" . RD asks if he means he does not want to continue tx and he replies   "would you?". RD urged him to talk with his family and oncologist about this desire, we will support him either way. For now, he is unable to eat and needs to meet 100% of needs via tube. Given lack of pump at home. Will start bolus feeds. Received verbal OK to start TF  Physical Exam: Deferred  Labs: Glu: 150, Albumin: 3.3 Meds: PPI, IVF  Recent Labs  Lab 04/08/18 0723 04/08/18 1025  NA 137  --   K 3.6  --   CL 97*  --   CO2 28  --   BUN 23*  --   CREATININE 0.68 0.66  CALCIUM 9.0  --   GLUCOSE 155*  --    NUTRITION - FOCUSED PHYSICAL EXAM: Deferred  Diet Order:  Diet NPO time specified  EDUCATION NEEDS:  No education needs have been identified at  this time  Skin:  Skin Assessment: Reviewed RN Assessment  Last BM:  4/7  Height:  Ht Readings from Last 1 Encounters:  04/08/18 5\' 10"  (1.778 m)   Weight:  Wt Readings from Last 1 Encounters:  04/08/18 159 lb (72.1 kg)   Wt Readings from Last 10 Encounters:  04/08/18 159 lb (72.1 kg)  01/28/18 187 lb 11.2 oz (85.1 kg)  01/27/18 188 lb (85.3 kg)  01/20/18 190 lb 8 oz (86.4 kg)  01/05/18 195 lb (88.5 kg)  08/27/16 189 lb (85.7 kg)  02/09/16 170 lb (77.1 kg)  01/25/16 170 lb 1.6 oz (77.2 kg)  12/18/15 188 lb 9.6 oz (85.5 kg)  11/09/15 186 lb 11.2 oz (84.7 kg)   Ideal Body Weight:  75.45 kg  BMI:  Body mass index is 22.81 kg/m.  Estimated Nutritional Needs:  Kcal:   2150-2300 (30-33 kcal/kg bw) Protein:  100-115g Pro (1.4-1.6 g Pro) Fluid:  >2.1 L fluid (30 ml/kg bw)  Burtis Junes RD, LDN, CNSC Clinical Nutrition Available Tues-Sat via Pager: [phone] 04/08/2018 5:57 PM

## 2018-04-08 NOTE — Evaluation (Signed)
Clinical/Bedside Swallow Evaluation Patient Details  Name: James Swanson MRN: [id number] Date of Birth: 06/24/1941  Today's Date: 04/08/2018 Time: SLP Start Time (ACUTE ONLY): 1320 SLP Stop Time (ACUTE ONLY): 1358 SLP Time Calculation (min) (ACUTE ONLY): 38 min  Past Medical History:  Past Medical History:  Diagnosis Date  . Anxiety   . Arthritis    back  . Chronic back pain   . Depression   . GERD (gastroesophageal reflux disease)   . Heavy cigarette smoker    2ppd  . Hypertension   . Laryngeal cancer ([place])   . 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 Vocal cord mass    Past Surgical History:  Past Surgical History:  Procedure Laterality Date  . ESOPHAGOSCOPY N/A 01/20/2018   Procedure: ESOPHAGOSCOPY;  Surgeon: Leta Baptist, MD;  Location: [place];  Service: ENT;  Laterality: N/A;  . HERNIA REPAIR Right   . LARYNGOSCOPY AND BRONCHOSCOPY N/A 01/20/2018   Procedure: DIRECT LARYNGOSCOPY WITH BIOPSY AND BRONCHOSCOPY;  Surgeon: Leta Baptist, MD;  Location: [place];  Service: ENT;  Laterality: N/A;  . MASS BIOPSY Right 01/05/2018   Procedure: RIGHT NECK MASS BIOPSY;  Surgeon: Leta Baptist, MD;  Location: [place];  Service: ENT;  Laterality: Right;  . MICROLARYNGOSCOPY Right 10/24/2015   Procedure: MICRO DIRECT LARYNGOSCOPY WITH VOCAL CORD BIOPSY ;  Surgeon: Leta Baptist, MD;  Location: [place];  Service: ENT;  Laterality: Right;  . PEG TUBE PLACEMENT    . PORTA CATH INSERTION    . TYMPANOPLASTY Right    HPI:  James Swanson is a 77 y.o. 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  He was recently discharged from [facility] 4 days ago after being treated for a  COPD exacerbation.  Patient denies any exertional chest pain or radiation of his neck or to his arm.  He states that his pain has now improved after administration of aspirin and nitroglycerin in the ED.  Patient and daughter at bedside state that he uses feeding supplementation through PEG tube, but is also usually able to eat and has not been able to do so over the last several days.      ENT notes from 02/04/2018 with Dr. 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 I am concerned that the vagus nerve and/or carotid artery is/are involved with tumor in the neck based on review of his updated imaging today. I think his best option is to undergo chemotherapy in the hopes of reducing the tumor involvement along with carotid artery. If this is accomplished, then he could undergo balloon test occlusion and possible embolization of the carotid with carotid artery resection. He is going to be undergoing chemotherapy near home. I would like to see him back with imaging after he is undergone a few rounds of chemotherapy. At this time, I will plan to see him in about 3 months.>>   Recent visit with Dr. 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 Treatment options are limited to reirradiation alone or with  the addition of chemotherapy. Given the patient's performance status he is not a good candidate for platinum based therapy. There is precident for the use of cetuximab in patients who receive reirradiation for recurrent head and neck cancer. He received the first (loading) dose of cetuximab on February 25 2018 and begin weekly cetuximab with XRT on March 04 2018. He received cycle 1 Day 29 of cetuximab on March 25 2018. XRT was held from March 25 2018 until April 06 2018 owing to severe mucositis and  COPD exacerbations. Now that he is receiving XRT again, I will restart Cetuximab. >>     Assessment / Plan / Recommendation Clinical Impression  Pt seen at bedside for a clinical swallow evaluation with his sister present in room. Pt tells SLP that he has not been able to swallow foods/liquids for a month and has solely relied on PEG feedings. Pt undergoing chemoradiation therapy and currently experiencing oral mucositis (posterior oral cavity) and odynophagia. Pt using Magic Mouthwash prior to admission every 4 hours per Pt report. Pt endorses nasal regurgitation with liquids when he does attempt to take them. He is not being followed by SLP at [facility] and no record of objective swallow assessment found in Epic. Pt assessed with ice chips and small sips of water. Suspect reduced hyolaryngeal excursion as it was difficult to palpate. Swallows were followed by delayed throat clearing and wet vocal quality. Risk for aspiration is suspected to be high given possible vocal cord paralysis noted in ENT note above.   Pt tells SLP that he does not want to continue with radiation treatment, but also states he will do whatever it takes to eat again. Recommend palliative care consult to help Pt/family establish long term goals of care as it relates to his cancer recurrence and treatment. Also recommend MBSS tomorrow to objectively evaluate swallow function (this can also be completed as an outpatient if needed) and continue NPO with nutrition via PEG. Pt encouraged to continue with oral care and take single ice chips periodically for comfort and to help prevent muscle disuse atrophy. Pt with need ongoing follow up with SLP services for dysphagia in the outpatient setting. Pt appreciative of recommendations.   SLP Visit Diagnosis: Dysphagia, oropharyngeal phase (R13.12)    Aspiration Risk  Severe aspiration risk;Risk for inadequate nutrition/hydration    Diet Recommendation NPO;Ice chips PRN after oral  care;Alternative means - long-term   Medication Administration: Via alternative means    Other  Recommendations Oral Care Recommendations: Oral care prior to ice chip/H20   Follow up Recommendations Outpatient SLP      Frequency and Duration min 2x/week  1 week       Prognosis Prognosis for Safe Diet Advancement: Fair Barriers to Reach Goals: Severity of deficits      Swallow Study   General Date of Onset: 04/08/18 HPI: James Swanson is a 77 y.o. 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  He was recently discharged from [facility] days ago after being treated for a COPD exacerbation.  Patient denies any exertional chest pain or radiation of his neck or to his arm.  He states that his pain has now improved after administration of aspirin and nitroglycerin in the ED.  Patient and daughter at bedside state that he uses feeding supplementation through PEG tube, but  is also usually able to eat and has not been able to do so over the last several days.   Type of Study: Bedside Swallow Evaluation Previous Swallow Assessment: None on record; BSE rec 2 years ago and Pt n/s Diet Prior to this Study: NPO;PEG tube Temperature Spikes Noted: No Respiratory Status: Room air History of Recent Intubation: No Behavior/Cognition: Alert;Cooperative;Pleasant mood Oral Cavity Assessment: Erythema;Lesions(evidence of mucositis posterior oral cavity) Oral Care Completed by SLP: Yes Oral Cavity - Dentition: Edentulous Vision: Functional for self-feeding Self-Feeding Abilities: Able to feed self Patient Positioning: Upright in bed Baseline Vocal Quality: Breathy;Hoarse;Wet Volitional Cough: Strong Volitional Swallow: Able to elicit(difficult to palpate)    Oral/Motor/Sensory Function Overall Oral  Motor/Sensory Function: Within functional limits   Ice Chips Ice chips: Impaired Pharyngeal Phase Impairments: Suspected delayed Swallow;Decreased hyoid-laryngeal movement;Multiple swallows;Wet Vocal Quality;Throat Clearing - Delayed   Thin Liquid Thin Liquid: Impaired Presentation: Cup;Self Fed Pharyngeal  Phase Impairments: Suspected delayed Swallow;Decreased hyoid-laryngeal movement;Multiple swallows;Wet Vocal Quality;Throat Clearing - Delayed(Pt reports nasal regurgitation)    Nectar Thick Nectar Thick Liquid: Not tested   Honey Thick Honey Thick Liquid: Not tested   Puree Puree: Not tested   Solid   Thank you,  Genene Churn, CCC-SLP [phone]    Solid: Not tested        James Swanson 04/08/2018,2:14 PM

## 2018-04-09 ENCOUNTER — Ambulatory Visit (HOSPITAL_COMMUNITY): Admission: RE | Admit: 2018-04-09 | Payer: Medicare HMO | Source: Ambulatory Visit

## 2018-04-09 DIAGNOSIS — R079 Chest pain, unspecified: Secondary | ICD-10-CM | POA: Diagnosis not present

## 2018-04-09 LAB — BASIC METABOLIC PANEL
Anion gap: 12 (ref 5–15)
BUN: 20 mg/dL (ref 6–20)
CALCIUM: 8.7 mg/dL — AB (ref 8.9–10.3)
CO2: 27 mmol/L (ref 22–32)
CREATININE: 0.66 mg/dL (ref 0.61–1.24)
Chloride: 100 mmol/L — ABNORMAL LOW (ref 101–111)
Glucose, Bld: 105 mg/dL — ABNORMAL HIGH (ref 65–99)
Potassium: 4 mmol/L (ref 3.5–5.1)
SODIUM: 139 mmol/L (ref 135–145)

## 2018-04-09 LAB — CBC
HCT: 47.5 % (ref 39.0–52.0)
Hemoglobin: 14.9 g/dL (ref 13.0–17.0)
MCH: 28.8 pg (ref 26.0–34.0)
MCHC: 31.4 g/dL (ref 30.0–36.0)
MCV: 91.7 fL (ref 78.0–100.0)
Platelets: 152 K/uL (ref 150–400)
RBC: 5.18 MIL/uL (ref 4.22–5.81)
RDW: 14.7 % (ref 11.5–15.5)
WBC: 10.1 K/uL (ref 4.0–10.5)

## 2018-04-09 LAB — GLUCOSE, CAPILLARY: GLUCOSE-CAPILLARY: 115 mg/dL — AB (ref 65–99)

## 2018-04-09 MED ORDER — NICOTINE 14 MG/24HR TD PT24: 14.0000 mg | MEDICATED_PATCH | Freq: Every day | TRANSDERMAL | 0 refills | Status: AC

## 2018-04-09 MED ORDER — JEVITY 1.2 CAL PO LIQD: 480.0000 mL | Freq: Four times a day (QID) | ORAL | 0 refills | Status: AC

## 2018-04-09 NOTE — Discharge Summary (Signed)
Physician Discharge Summary  James Swanson:[id number] DOB: 27-Oct-1941 DOA: 04/08/2018  PCP: Dione Housekeeper, MD  Admit date: 04/08/2018  Discharge date: 04/09/2018  Admitted From:Home  Disposition:  Home  Recommendations for Outpatient Follow-up:  1. Follow up with PCP in 1-2 weeks 2. Follow up with Oncology as scheduled  Home Health:N/A  Equipment/Devices:Home O2, 2L [place]  Discharge Condition:Stable  CODE STATUS: Full  Diet recommendation: NPO; on ice chips. Continue PEG feeds with Ensure as recommended.  Brief/Interim Summary:  James Swanson is a 77 y.o. 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  He was recently discharged from [facility] 4 days ago after being treated for a COPD exacerbation. He has been ruled out for ACS with flat trend to his troponins. He is stable for discharge, but will require some home oxygen 2L [place]. He has been seen by SLP with recommendations to remain NPO except Ice chips and continue tube feeds per nutrition recommendations. He is eager to go home at this time and refuses to have a discussion with Palliative Care as recommended.    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 Chest pain.  Resolved with flat trend in troponins.  2. Worsening dysphagia secondary to laryngeal carcinoma.  SLP recommendations to remain NPO with ice chips only. Patient would like to discuss this further with his Oncology team prior to [facility]. Continue PEG tube feeds as recommended. 3. Tobacco abuse.  Nicotine patch.  States that he actually quit a few days back. 4. COPD. Will be discharged on 2L  home  oxygen. 5. Hypertension.  Continue home medications. 6. GERD.  Continue on Protonix.    Discharge Instructions  Discharge Instructions    Diet - low sodium heart healthy   Complete by:  As directed    Increase activity slowly   Complete by:  As directed      Allergies as of 04/09/2018      Reactions   Asa [aspirin]    GI upset      Medication List    TAKE these medications   acetaminophen 500 MG tablet Commonly known as:  TYLENOL Take 1,000 mg by mouth every 6 (six) hours as needed.   amLODipine 5 MG tablet Commonly known as:  NORVASC TAKE ONE TABLET BY MOUTH ONCE DAILY   aspirin 81 MG chewable tablet Chew by mouth.   atorvastatin 40 MG tablet Commonly known as:  LIPITOR Take 40 mg by mouth daily.   escitalopram 10 MG tablet Commonly known as:  LEXAPRO Take 1 tablet by mouth daily.   feeding supplement (JEVITY 1.2 CAL) Liqd Place 480 mLs into feeding tube 4 (four) times daily.   FIBER PO Take 1 Can by mouth 6 (six) times daily.   FIRST-DUKES MOUTHWASH Susp Use as directed 5 mLs in the mouth or throat every 4 (four) hours as needed.   HYDROcodone-acetaminophen 5-325 MG tablet Commonly known as:  NORCO/VICODIN Take 1 tablet by mouth every 6 (six) hours as needed for moderate pain.   lidocaine 2 % solution Commonly known as:  XYLOCAINE Take 5 mLs by mouth daily.   nicotine 14 mg/24hr patch Commonly known  as:  NICODERM CQ - dosed in mg/24 hours Place 1 patch (14 mg total) onto the skin daily. Start taking on:  04/10/2018   nitroGLYCERIN 0.4 MG SL tablet Commonly known as:  NITROSTAT DISSOLVE ONE TABLET UNDER THE TONGUE EVERY 5 MINUTES AS NEEDED FOR CHEST PAIN.  DO NOT EXCEED A TOTAL OF 3 DOSES IN 15 MINUTES   pantoprazole 40 MG tablet Commonly known as:  PROTONIX Take 40 mg by mouth daily.   predniSONE 10 MG tablet Commonly known as:  DELTASONE Take 1 tablet by mouth daily.   prochlorperazine 10 MG tablet Commonly known as:  COMPAZINE Take 1  tablet by mouth every 6 (six) hours as needed.   PROVENTIL HFA 108 (90 Base) MCG/ACT inhaler Generic drug:  albuterol Inhale 1-2 puffs into the lungs every 4 (four) hours.   RESTORIL 15 MG capsule Generic drug:  temazepam Take 1 capsule by mouth at bedtime.            Durable Medical Equipment  (From admission, onward)        Start     Ordered   04/09/18 0955  DME Oxygen  Once    Question Answer Comment  Mode or (Route) Nasal cannula   Liters per Minute 2   Frequency Continuous (stationary and portable oxygen unit needed)   Oxygen conserving device Yes   Oxygen delivery system Gas      04/09/18 0955   04/09/18 0954  For home use only DME oxygen  Once    Question Answer Comment  Mode or (Route) Nasal cannula   Liters per Minute 2   Frequency Continuous (stationary and portable oxygen unit needed)   Oxygen delivery system Gas      04/09/18 0953     Follow-up Information    Dione Housekeeper, MD Follow up in 1 week(s).   Specialty:  Family Medicine Contact information: [place] [id number] [phone]          Allergies  Allergen Reactions  . Asa [Aspirin]     GI upset    Consultations:  None   Procedures/Studies: Dg Chest 2 View  Result Date: 04/08/2018 CLINICAL DATA:  Onset of shortness of breath and occasional sharp chest pain last night. Currently undergoing chemo radiation for throat malignancy. History of COPD. Former smoker. EXAM: CHEST - 2 VIEW COMPARISON:  Chest x-ray of April 03, 2018 FINDINGS: The lungs are adequately inflated. The interstitial markings are mildly prominent though stable. There is no alveolar infiltrate, pleural effusion, or pneumothorax. The heart and pulmonary vascularity are normal. There is calcification in the wall of the aortic arch. The porta catheter tip projects over the midportion of the SVC. There is no pleural effusion. There is mild multilevel degenerative disc disease of the thoracic spine.  IMPRESSION: Chronic bronchitic-smoking related changes. No pneumonia nor other acute cardiopulmonary abnormality. Thoracic aortic atherosclerosis. Electronically Signed   By: David  Martinique M.D.   On: 04/08/2018 08:14   Discharge Exam: Vitals:   04/09/18 0920 04/09/18 0923  BP:    Pulse:    Resp:    Temp:    SpO2: (!) 88% 93%   Vitals:   04/08/18 2201 04/09/18 0558 04/09/18 0920 04/09/18 0923  BP:  112/77    Pulse:  98    Resp:      Temp:  97.6 F (36.4 C)    TempSrc:  Oral    SpO2: 93% 97% (!) 88% 93%  Weight:  Height:        General: Pt is alert, awake, not in acute distress Cardiovascular: RRR, S1/S2 +, no rubs, no gallops Respiratory: CTA bilaterally, no wheezing, no rhonchi Abdominal: Soft, NT, ND, bowel sounds +; PEG C/D/I Extremities: no edema, no cyanosis    The results of significant diagnostics from this hospitalization (including imaging, microbiology, ancillary and laboratory) are listed below for reference.     Microbiology: No results found for this or any previous visit (from the past 240 hour(s)).   Labs: BNP (last 3 results) Recent Labs    04/08/18 0729  BNP 212.2*   Basic Metabolic Panel: Recent Labs  Lab 04/08/18 0723 04/08/18 1025 04/09/18 0443  NA 137  --  139  K 3.6  --  4.0  CL 97*  --  100*  CO2 28  --  27  GLUCOSE 155*  --  105*  BUN 23*  --  20  CREATININE 0.68 0.66 0.66  CALCIUM 9.0  --  8.7*   Liver Function Tests: Recent Labs  Lab 04/08/18 0723  AST 35  ALT 154*  ALKPHOS 23*  BILITOT 1.2  PROT 6.1*  ALBUMIN 3.3*   No results for input(s): LIPASE, AMYLASE in the last 168 hours. No results for input(s): AMMONIA in the last 168 hours. CBC: Recent Labs  Lab 04/08/18 0723 04/08/18 1025 04/09/18 0443  WBC 9.1 9.1 10.1  NEUTROABS 7.9*  --   --   HGB 14.9 13.8 14.9  HCT 46.2 43.2 47.5  MCV 91.5 91.9 91.7  PLT 153 161 152   Cardiac Enzymes: Recent Labs  Lab 04/08/18 1025 04/08/18 1625 04/08/18 2227   TROPONINI <0.03 <0.03 <0.03   BNP: Invalid input(s): POCBNP CBG: Recent Labs  Lab 04/09/18 0802  GLUCAP 115*   D-Dimer No results for input(s): DDIMER in the last 72 hours. Hgb A1c No results for input(s): HGBA1C in the last 72 hours. Lipid Profile No results for input(s): CHOL, HDL, LDLCALC, TRIG, CHOLHDL, LDLDIRECT in the last 72 hours. Thyroid function studies No results for input(s): TSH, T4TOTAL, T3FREE, THYROIDAB in the last 72 hours.  Invalid input(s): FREET3 Anemia work up No results for input(s): VITAMINB12, FOLATE, FERRITIN, TIBC, IRON, RETICCTPCT in the last 72 hours. Urinalysis    Component Value Date/Time   COLORURINE YELLOW 04/08/2018 1251   APPEARANCEUR HAZY (A) 04/08/2018 1251   LABSPEC 1.014 04/08/2018 1251   PHURINE 7.0 04/08/2018 1251   GLUCOSEU NEGATIVE 04/08/2018 1251   HGBUR NEGATIVE 04/08/2018 1251   BILIRUBINUR NEGATIVE 04/08/2018 1251   KETONESUR NEGATIVE 04/08/2018 1251   PROTEINUR NEGATIVE 04/08/2018 1251   NITRITE NEGATIVE 04/08/2018 1251   LEUKOCYTESUR NEGATIVE 04/08/2018 1251   Sepsis Labs Invalid input(s): PROCALCITONIN,  WBC,  LACTICIDVEN Microbiology No results found for this or any previous visit (from the past 240 hour(s)).   Time coordinating discharge: 35 minutes  SIGNED:   Rodena Goldmann, DO [facility] 04/09/2018, 9:58 AM Pager [phone]  If 7PM-7AM, please contact night-coverage [email or web address] Password [email or web address]

## 2018-04-09 NOTE — Progress Notes (Signed)
IV removed, WNL. D/C instructions given to pt. Verbalized understanding. Daughter at bedside to transport home.

## 2018-04-09 NOTE — Care Management Note (Signed)
Case Management Note  Patient Details  Name: James Swanson MRN: [id number] Date of Birth: [birth year]  Subjective/Objective:     Admitted with chest pain. Pt from home, ind pta. Active with [facility] HH services.                Action/Plan: DC home today. Pt obs, does not need order to resume [place] services. Pt meets criteria for home O2, he wants to use [facility]. After O2 delivered pt refused, says he doesn't want it. He will 'let us know' if he changes his mind. CM explained he will have to talk to his PCP about that once he leaves the hospital. Pt leaving in a hurry to get to PCP appointment. CM notified [place] rep who will pick DME up from room.   CM spoke with pts daughter in the hall. Says she believes the patient is "trying to commit suicide". He has 2 weeks left of his chemo/radiation regimen but has refused treatments the last two days. (he is supposed to get 2 radiations a day and one chemo). She says she was waiting for him to finish treatment on Tuesday and he ran out of the office and out of the building. Daughter believes he was given bad news but he will not tell her. Pt has told her our MD's are telling him to not get any more treatment. Daughter encouraged to contact cancer center (at [facility]) and discuss with nurses any new information given or pt's change in plan.    Expected Discharge Date:  04/09/18               Expected Discharge Plan:  [place]  In-House Referral:  NA  Discharge planning Services  CM Consult  Post Acute Care Choice:  Home Health Choice offered to:  Patient  HH Arranged:  RN [facility] Agency:  [facility]  Status of Service:  Completed, signed off  Sherald Barge, RN 04/09/2018, 11:09 AM

## 2018-04-09 NOTE — Progress Notes (Signed)
SATURATION QUALIFICATIONS: (This note is used to comply with regulatory documentation for home oxygen)  Patient Saturations on Room Air at Rest = 88%  Patient Saturations on Room Air while Ambulating = 88%  Patient Saturations on 2 Liters of oxygen while Ambulating = 94%  Please briefly explain why patient needs home oxygen: Patient desat's when not on any oxygen.

## 2018-04-30 MED ORDER — SENNOSIDES-DOCUSATE SODIUM 8.6-50 MG PO TABS
1.00 | ORAL_TABLET | ORAL | Status: DC
Start: 2018-04-30 — End: 2018-04-30

## 2018-04-30 MED ORDER — ACETAMINOPHEN 325 MG PO TABS
325.00 | ORAL_TABLET | ORAL | Status: DC
Start: 2018-04-30 — End: 2018-04-30

## 2018-04-30 MED ORDER — ENOXAPARIN SODIUM 40 MG/0.4ML ~~LOC~~ SOLN
40.00 | SUBCUTANEOUS | Status: DC
Start: 2018-05-01 — End: 2018-04-30

## 2018-04-30 MED ORDER — POLYETHYLENE GLYCOL 3350 17 G PO PACK
17.00 g | PACK | ORAL | Status: DC
Start: 2018-05-01 — End: 2018-04-30

## 2018-04-30 MED ORDER — NEOMYCIN-POLYMYXIN-DEXAMETH 3.5-10000-0.1 OP OINT
TOPICAL_OINTMENT | OPHTHALMIC | Status: DC
Start: 2018-04-30 — End: 2018-04-30

## 2018-04-30 MED ORDER — GENERIC EXTERNAL MEDICATION
40.00 | Status: DC
Start: 2018-04-30 — End: 2018-04-30

## 2018-04-30 MED ORDER — ASPIRIN 81 MG PO CHEW
81.00 | CHEWABLE_TABLET | ORAL | Status: DC
Start: 2018-05-01 — End: 2018-04-30

## 2018-04-30 MED ORDER — GENERIC EXTERNAL MEDICATION
Status: DC
Start: 2018-04-30 — End: 2018-04-30

## 2018-04-30 MED ORDER — ALBUTEROL SULFATE (2.5 MG/3ML) 0.083% IN NEBU
2.50 | INHALATION_SOLUTION | RESPIRATORY_TRACT | Status: DC
Start: ? — End: 2018-04-30

## 2018-04-30 MED ORDER — MELATONIN 3 MG PO TABS
3.00 | ORAL_TABLET | ORAL | Status: DC
Start: 2018-04-30 — End: 2018-04-30

## 2018-04-30 MED ORDER — OXYCODONE-ACETAMINOPHEN 5-325 MG PO TABS
1.00 | ORAL_TABLET | ORAL | Status: DC
Start: ? — End: 2018-04-30

## 2018-05-03 MED ORDER — GENERIC EXTERNAL MEDICATION
40.00 | Status: DC
Start: 2018-05-03 — End: 2018-05-03

## 2018-05-03 MED ORDER — ENOXAPARIN SODIUM 40 MG/0.4ML ~~LOC~~ SOLN
40.00 | SUBCUTANEOUS | Status: DC
Start: 2018-05-04 — End: 2018-05-03

## 2018-05-03 MED ORDER — POLYETHYLENE GLYCOL 3350 17 G PO PACK
17.00 g | PACK | ORAL | Status: DC
Start: 2018-05-04 — End: 2018-05-03

## 2018-05-03 MED ORDER — ASPIRIN 81 MG PO CHEW
81.00 | CHEWABLE_TABLET | ORAL | Status: DC
Start: 2018-05-04 — End: 2018-05-03

## 2018-05-03 MED ORDER — ESCITALOPRAM OXALATE 10 MG PO TABS
10.00 | ORAL_TABLET | ORAL | Status: DC
Start: 2018-05-04 — End: 2018-05-03

## 2018-05-03 MED ORDER — ATORVASTATIN CALCIUM 40 MG PO TABS
40.00 | ORAL_TABLET | ORAL | Status: DC
Start: 2018-05-04 — End: 2018-05-03

## 2018-05-03 MED ORDER — GENERIC EXTERNAL MEDICATION
Status: DC
Start: 2018-05-03 — End: 2018-05-03

## 2018-05-03 MED ORDER — SENNOSIDES-DOCUSATE SODIUM 8.6-50 MG PO TABS
1.00 | ORAL_TABLET | ORAL | Status: DC
Start: 2018-05-03 — End: 2018-05-03

## 2018-05-03 MED ORDER — AMLODIPINE BESYLATE 5 MG PO TABS
5.00 | ORAL_TABLET | ORAL | Status: DC
Start: 2018-05-04 — End: 2018-05-03

## 2018-05-03 MED ORDER — OXYCODONE-ACETAMINOPHEN 5-325 MG PO TABS
1.00 | ORAL_TABLET | ORAL | Status: DC
Start: ? — End: 2018-05-03

## 2018-06-08 ENCOUNTER — Other Ambulatory Visit (HOSPITAL_COMMUNITY): Payer: Self-pay | Admitting: Radiation Oncology

## 2018-06-08 DIAGNOSIS — C770 Secondary and unspecified malignant neoplasm of lymph nodes of head, face and neck: Secondary | ICD-10-CM

## 2018-06-08 DIAGNOSIS — C329 Malignant neoplasm of larynx, unspecified: Secondary | ICD-10-CM

## 2018-06-16 ENCOUNTER — Ambulatory Visit (HOSPITAL_COMMUNITY)
Admission: RE | Admit: 2018-06-16 | Discharge: 2018-06-16 | Disposition: A | Discharge status: Home / Self Care | Payer: Medicare HMO | Source: Ambulatory Visit | Attending: Radiation Oncology | Admitting: Radiation Oncology

## 2018-06-16 DIAGNOSIS — C329 Malignant neoplasm of larynx, unspecified: Secondary | ICD-10-CM | POA: Diagnosis not present

## 2018-06-16 DIAGNOSIS — I2510 Atherosclerotic heart disease of native coronary artery without angina pectoris: Secondary | ICD-10-CM | POA: Diagnosis not present

## 2018-06-16 DIAGNOSIS — C770 Secondary and unspecified malignant neoplasm of lymph nodes of head, face and neck: Secondary | ICD-10-CM | POA: Insufficient documentation

## 2018-06-16 DIAGNOSIS — J432 Centrilobular emphysema: Secondary | ICD-10-CM | POA: Diagnosis not present

## 2018-06-16 DIAGNOSIS — Z79899 Other long term (current) drug therapy: Secondary | ICD-10-CM | POA: Insufficient documentation

## 2018-06-16 DIAGNOSIS — I700 Atherosclerosis of aorta: Secondary | ICD-10-CM | POA: Diagnosis not present

## 2018-06-16 DIAGNOSIS — Z7982 Long term (current) use of aspirin: Secondary | ICD-10-CM | POA: Diagnosis not present

## 2018-06-16 DIAGNOSIS — I77811 Abdominal aortic ectasia: Secondary | ICD-10-CM | POA: Insufficient documentation

## 2018-06-16 LAB — GLUCOSE, CAPILLARY: Glucose-Capillary: 92 mg/dL (ref 65–99)

## 2018-06-16 MED ORDER — FLUDEOXYGLUCOSE F - 18 (FDG) INJECTION
7.3500 | Freq: Once | INTRAVENOUS | Status: AC
  Administered 2018-06-16: 7.35 via INTRAVENOUS

## 2018-07-13 ENCOUNTER — Ambulatory Visit (INDEPENDENT_AMBULATORY_CARE_PROVIDER_SITE_OTHER): Payer: Medicare HMO | Admitting: Otolaryngology

## 2018-07-13 DIAGNOSIS — C320 Malignant neoplasm of glottis: Secondary | ICD-10-CM | POA: Diagnosis not present

## 2018-07-13 DIAGNOSIS — R490 Dysphonia: Secondary | ICD-10-CM

## 2018-07-13 DIAGNOSIS — D487 Neoplasm of uncertain behavior of other specified sites: Secondary | ICD-10-CM

## 2018-07-27 ENCOUNTER — Other Ambulatory Visit (HOSPITAL_COMMUNITY): Payer: Self-pay | Admitting: Otolaryngology

## 2018-07-27 DIAGNOSIS — C329 Malignant neoplasm of larynx, unspecified: Secondary | ICD-10-CM

## 2018-08-13 ENCOUNTER — Ambulatory Visit (HOSPITAL_COMMUNITY)
Admission: RE | Admit: 2018-08-13 | Discharge: 2018-08-13 | Disposition: A | Discharge status: Home / Self Care | Payer: Medicare HMO | Source: Ambulatory Visit | Attending: Otolaryngology | Admitting: Otolaryngology

## 2018-08-13 DIAGNOSIS — R590 Localized enlarged lymph nodes: Secondary | ICD-10-CM | POA: Diagnosis not present

## 2018-08-13 DIAGNOSIS — K111 Hypertrophy of salivary gland: Secondary | ICD-10-CM | POA: Insufficient documentation

## 2018-08-13 DIAGNOSIS — C329 Malignant neoplasm of larynx, unspecified: Secondary | ICD-10-CM | POA: Diagnosis not present

## 2018-08-13 DIAGNOSIS — R600 Localized edema: Secondary | ICD-10-CM | POA: Insufficient documentation

## 2018-08-13 LAB — POCT I-STAT CREATININE: CREATININE: 0.7 mg/dL (ref 0.61–1.24)

## 2018-08-13 MED ORDER — IOHEXOL 300 MG/ML  SOLN
75.0000 mL | Freq: Once | INTRAMUSCULAR | Status: AC | PRN
  Administered 2018-08-13: 75 mL via INTRAVENOUS

## 2018-09-09 ENCOUNTER — Other Ambulatory Visit (HOSPITAL_COMMUNITY)
Admission: RE | Admit: 2018-09-09 | Discharge: 2018-09-09 | Disposition: A | Discharge status: Home / Self Care | Payer: Medicare HMO | Source: Ambulatory Visit | Attending: Oncology | Admitting: Oncology

## 2018-09-09 DIAGNOSIS — C4492 Squamous cell carcinoma of skin, unspecified: Secondary | ICD-10-CM | POA: Diagnosis present

## 2018-10-30 DEATH — deceased

## 2019-10-13 IMAGING — CT CT NECK W/ CM
3 of 5 series · 12 of 33 positions shown, 14 images · IV contrast (iopamidol)
Comparison: Neck CT at initial presentation 11/15/2015.

ADDENDUM:
Study discussed by telephone with Dr. ZUN MAZUIN on 11/24/2017 at [DATE]
.

This case was presented and discussed at Head and Neck Tumor Board
this morning.
It is unclear when this patient was most recently endoscoped.
And the patient's previously treated 5296 laryngeal primary was a T2
There is an asymmetric appearance of the right hypopharynx.
So considering these and the new right neck nodal mass, an
endoscopic evaluation of the pharynx and larynx was recommended.
PET-CT also is pending at this time.
CLINICAL DATA: 76-year-old male with history of right laryngeal
mass in 5296. Squamous cell carcinoma, HPV negative, of the right
vocal cord status post treatment with radiation therapy completed in
December 2015. New right neck mass discovered several weeks ago.
EXAM:
CT NECK WITH CONTRAST
TECHNIQUE: Multidetector CT imaging of the neck was performed using the
standard protocol following the bolus administration of intravenous
contrast.
CONTRAST:  75mL [id number] IOPAMIDOL ([id number]) INJECTION 61%

[Series 2: axial neck · axial · 0.58mm/px · z∈[+1384,+1530]mm · 4 of 123 slices shown, 5 images]
[im 25/123  soft-tissue]
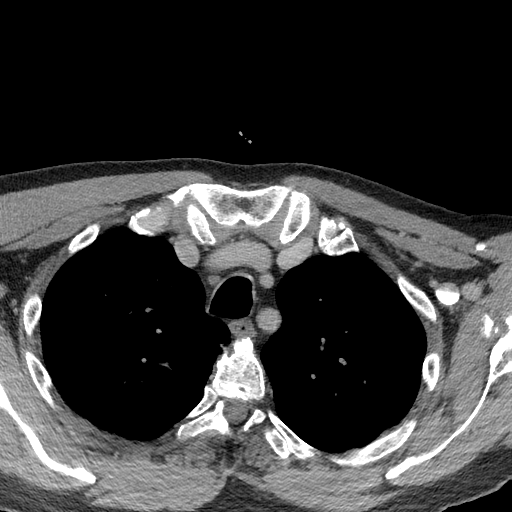
[im 25/123  bone]
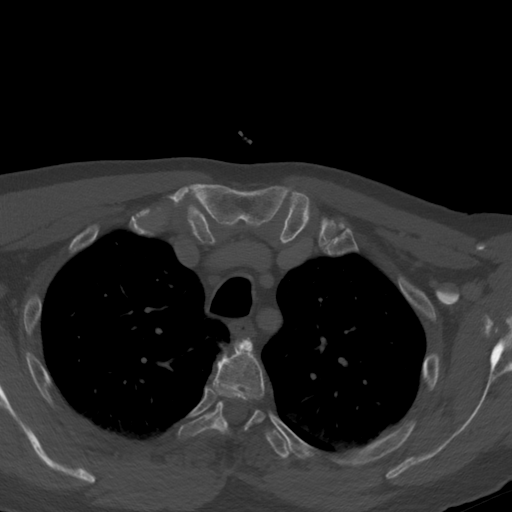
[im 49/123  bone]
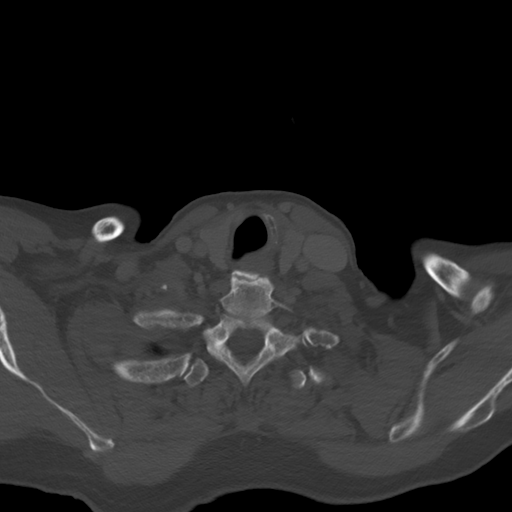
[im 74/123  bone]
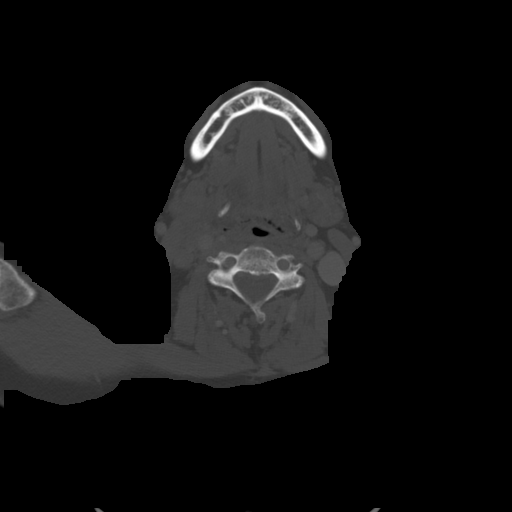
[im 98/123  bone]
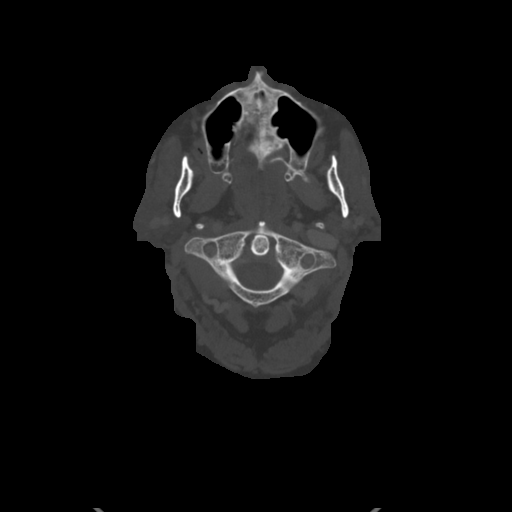

[Series 6: coronal neck · coronal · 0.48mm/px · 3 of 135 slices shown]
[im 27/135  bone]
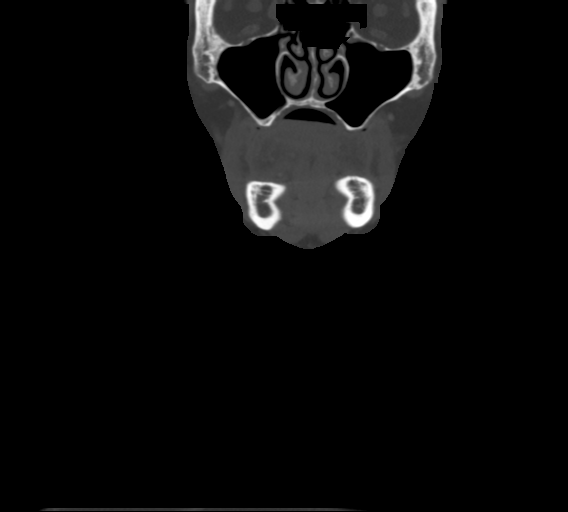
[im 54/135  bone]
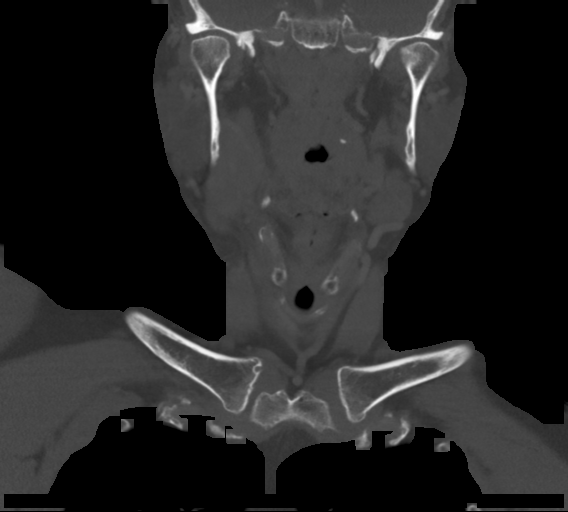
[im 81/135  bone]
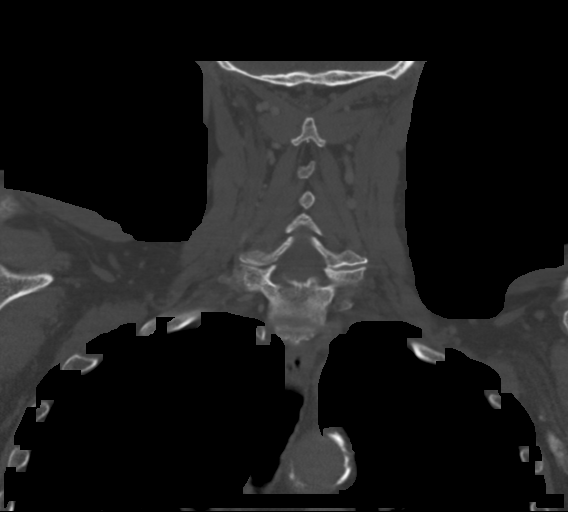

[Series 7: sagittal neck · sagittal · 0.48mm/px · 5 of 135 slices shown, 6 images]
[im 45/135  bone]
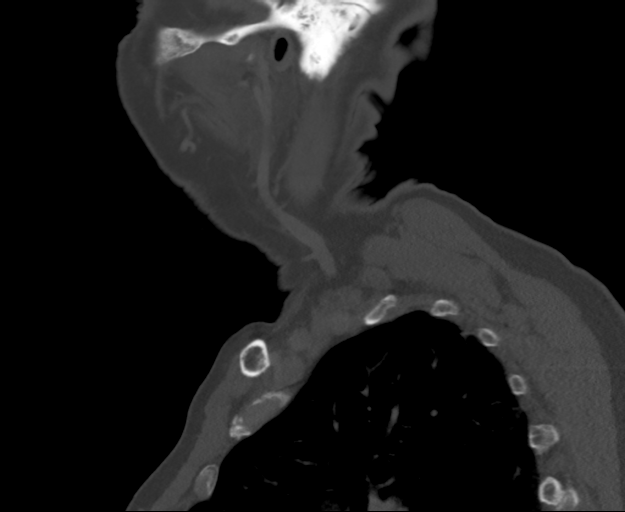
[im 56/135  bone]
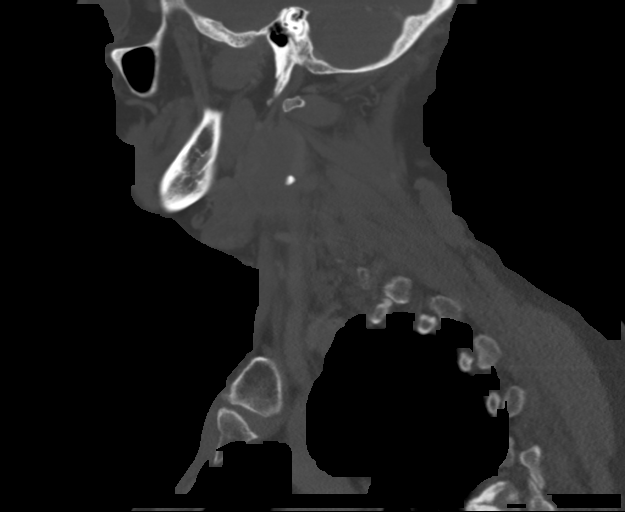
[im 68/135  soft-tissue]
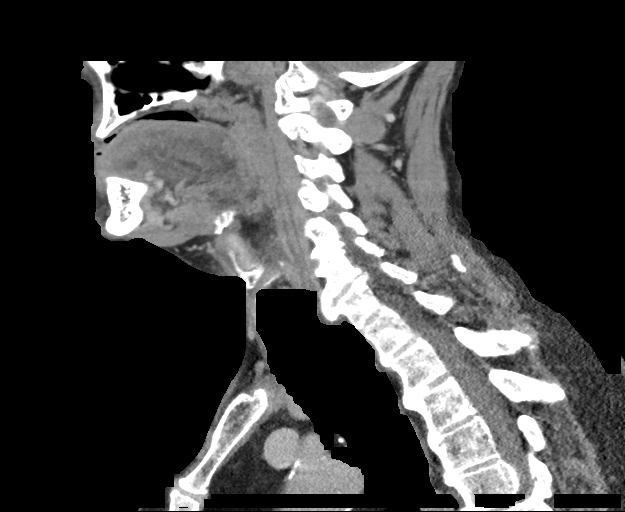
[im 68/135  bone]
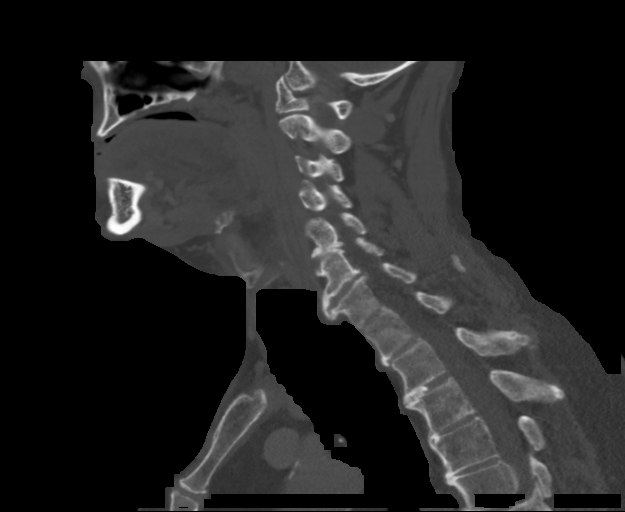
[im 79/135  bone]
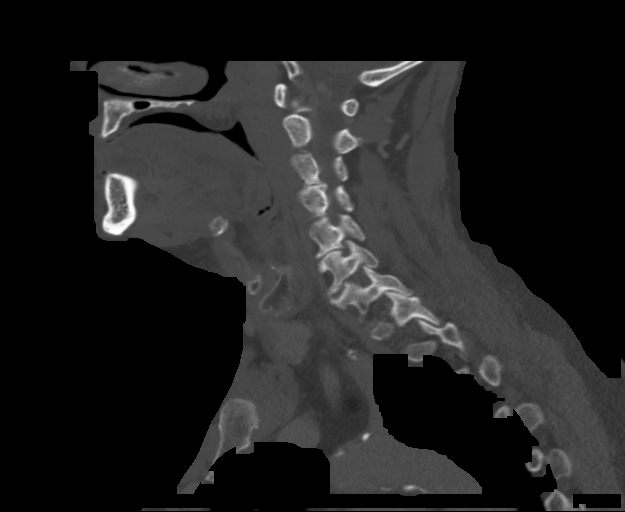
[im 90/135  bone]
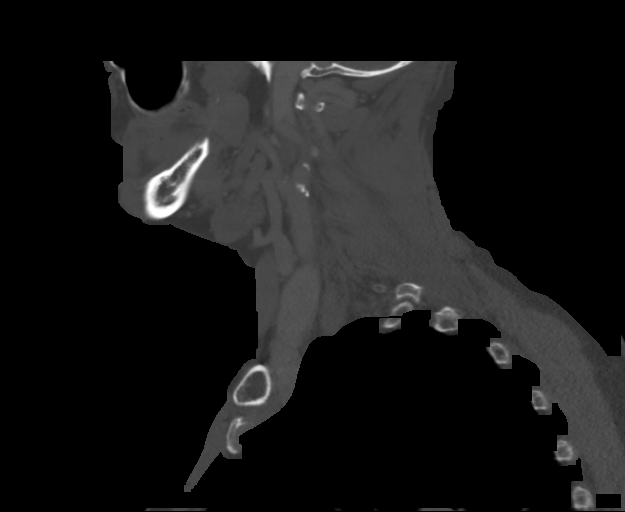

[12 of 33 positions shown; findings below may reference images not displayed]

FINDINGS: Pharynx and larynx: Generalized pharyngeal mucosal space soft tissue
thickening with no discrete laryngeal or pharyngeal mass or hyper
enhancement. Probable benign retention cyst in the left palatine
tonsil near chronic postinflammatory calcifications (series 2, image
33). Negative superior parapharyngeal spaces. Negative
retropharyngeal space.

Salivary glands: Negative sublingual space. Submandibular glands are
within normal limits. Parotid glands are within normal limits.

Thyroid: Negative.

Lymph nodes: Large oval heterogeneous soft tissue mass centered at
the right level 2 nodal station is new since 5296 and encompasses 36
x 20 x 41 mm (AP by transverse by CC). This has spiculated and
lobulated posterior and inferior margins. There is dystrophic
internal calcification with adjacent indistinct hypodensity near the
center of of the mass on series 2, image 46.

The mass is inseparable from both the right carotid space and the
anterior and medial right sternocleidomastoid muscle. The right
internal jugular vein is invaded and thrombosed the right carotid
bifurcation is involved but remains patent.

The abnormality extends to the margin of the right level 3 nodal
station. Caudal to the dominant mass right level 3 and level 4 nodes
appear within normal limits. No level 1 lymphadenopathy. No
contralateral left neck lymphadenopathy.

Vascular: Right carotid space involvement by the right neck mass
with invasion and thrombosis of the right IJ. Other major vascular
structures in the neck and at the skullbase are patent.

Limited intracranial: Negative.

Visualized orbits: Negative.

Mastoids and visualized paranasal sinuses: Chronic inferior mastoid
effusions. Visualized paranasal sinuses are stable and well
pneumatized. Tympanic cavities remain clear.

Skeleton: Absent dentition. Cervical spine degeneration. No acute or
suspicious osseous lesion identified.

Upper chest: Centrilobular emphysema. Stable visible lung apices
since 5296. Stable visible superior mediastinum with no superior
mediastinal or visible axillary lymphadenopathy. Calcified aortic
atherosclerosis.
IMPRESSION: 1. Large, 4.1 cm lobulated right neck soft tissue mass consistent
with malignant lymph node conglomeration with extracapsular
extension. This spans the right level 2 and cephalad aspect of the
level 3 nodal station. There is involvement of the right carotid
space (see #2), and the right sternocleidomastoid muscle.
2. Invasion and occlusion of the right internal jugular vein. The
right carotid remains patent.
3. No recurrent laryngeal or pharyngeal tumor identified. No
contralateral left lymphadenopathy.
4. Aortic Atherosclerosis (PBSAP-UB7.7) and Emphysema (PBSAP-MX2.F).
The office of Dr. ZUN MAZUIN is closed today. We are attempting to
contact Dr. Masarnia or an on-call provider regarding the findings on
this study by alternative means.
